# Patient Record
Sex: Female | Born: 1975 | Race: Asian | Hispanic: Refuse to answer | Marital: Married | Smoking: Never smoker
Health system: Southern US, Community
[De-identification: ages and names within clinical notes are randomized; demographics above are authoritative.]

## PROBLEM LIST (undated history)

## (undated) ENCOUNTER — Inpatient Hospital Stay (HOSPITAL_COMMUNITY): Payer: Self-pay

## (undated) DIAGNOSIS — K2970 Gastritis, unspecified, without bleeding: Secondary | ICD-10-CM

## (undated) DIAGNOSIS — B9681 Helicobacter pylori [H. pylori] as the cause of diseases classified elsewhere: Secondary | ICD-10-CM

## (undated) DIAGNOSIS — O039 Complete or unspecified spontaneous abortion without complication: Secondary | ICD-10-CM

## (undated) HISTORY — DX: Complete or unspecified spontaneous abortion without complication: O03.9

---

## 2012-06-29 ENCOUNTER — Inpatient Hospital Stay (HOSPITAL_COMMUNITY)
Admission: AD | Admit: 2012-06-29 | Discharge: 2012-06-29 | Disposition: A | Discharge status: Home / Self Care | Payer: Managed Care, Other (non HMO) | Source: Ambulatory Visit | Attending: Obstetrics & Gynecology | Admitting: Obstetrics & Gynecology

## 2012-06-29 ENCOUNTER — Encounter (HOSPITAL_COMMUNITY): Payer: Self-pay | Admitting: *Deleted

## 2012-06-29 ENCOUNTER — Inpatient Hospital Stay (HOSPITAL_COMMUNITY): Payer: Managed Care, Other (non HMO)

## 2012-06-29 DIAGNOSIS — O200 Threatened abortion: Secondary | ICD-10-CM

## 2012-06-29 DIAGNOSIS — O209 Hemorrhage in early pregnancy, unspecified: Secondary | ICD-10-CM | POA: Insufficient documentation

## 2012-06-29 DIAGNOSIS — O039 Complete or unspecified spontaneous abortion without complication: Secondary | ICD-10-CM

## 2012-06-29 HISTORY — DX: Complete or unspecified spontaneous abortion without complication: O03.9

## 2012-06-29 LAB — CBC
HCT: 43.4 % (ref 36.0–46.0)
MCH: 30 pg (ref 26.0–34.0)
MCV: 87.5 fL (ref 78.0–100.0)
WBC: 10.2 10*3/uL (ref 4.0–10.5)

## 2012-06-29 LAB — URINE MICROSCOPIC-ADD ON

## 2012-06-29 LAB — URINALYSIS, ROUTINE W REFLEX MICROSCOPIC
Bilirubin Urine: NEGATIVE
Glucose, UA: NEGATIVE mg/dL
Specific Gravity, Urine: 1.02 (ref 1.005–1.030)
Urobilinogen, UA: 0.2 mg/dL (ref 0.0–1.0)
pH: 6 (ref 5.0–8.0)

## 2012-06-29 LAB — POCT PREGNANCY, URINE: Preg Test, Ur: POSITIVE — AB

## 2012-06-29 LAB — HCG, QUANTITATIVE, PREGNANCY: hCG, Beta Chain, Quant, S: 10021 m[IU]/mL — ABNORMAL HIGH (ref <5)

## 2012-06-29 NOTE — MAU Provider Note (Signed)
History     CSN: [id number]  Arrival date and time: 06/29/12 0757   First Provider Initiated Contact with Patient 06/29/12 [phone]      Chief Complaint  Patient presents with  . Vaginal Bleeding   HPI Patient is a 36 yo G2P1001 at 11.3 EGA by LMP who presents with complaint of vaginal bleeding.  Patient states this began yesterday when she noticed a small amount of pink discharge as she wiped after going to the bathroom.  This morning she states she noticed some drops of red blood in the toilet.  Since then she has continued to bleed. She has been wearing a pad and has changed it 2x, with only small amounts of blood on each pad. Her current pad has a small amount of blood on it as well. She also noted some lower abdominal cramping this morning that is like her period.  States this has not gotten any better.  She denies any intercourse or vaginal exams since finding out she was pregnant.  She denies any other complaints.  OB History    Grav Para Term Preterm Abortions TAB SAB Ect Mult Living   2 1 1       1      No prenatal care to this point, just recently found out she is pregnant.  Past Medical History  Diagnosis Date  . No pertinent past medical history     Past Surgical History  Procedure Date  . Cesarean section     History reviewed. No pertinent family history.  History  Substance Use Topics  . Smoking status: Never Smoker   . Smokeless tobacco: Never Used  . Alcohol Use: No    Allergies: No Known Allergies  Prescriptions prior to admission  Medication Sig Dispense Refill  . Prenatal Vit-Fe Fumarate-FA (PRENATAL MULTIVITAMIN) TABS Take 1 tablet by mouth daily.        ROS negative except per HPI Physical Exam   Blood pressure 109/76, pulse 83, temperature 98.3 F (36.8 C), temperature source Oral, resp. rate 16, height 5' 1.75" (1.568 m), weight 50.349 kg (111 lb), last menstrual period 04/10/2012, SpO2 100.00%.  Physical Exam  Constitutional: She appears  well-developed and well-nourished.  HENT:  Head: Normocephalic and atraumatic.  Cardiovascular: Normal rate, regular rhythm and normal heart sounds.   Respiratory: Effort normal and breath sounds normal.  GI: Soft. Bowel sounds are normal. She exhibits no distension. There is tenderness (suprapubic and right lower quadrant tenderness). There is no rebound and no guarding.  Genitourinary:       Speculum: blood visualized in vaginal vault and posterior vaginal fornix, appeared to be small amount of blood coming out of the cervix, cervix closed Bimanual: suprapubic tenderness and right adnexal tenderness   OB US less than 14 weeks: intrauterine pregnancy identified, irregular gestational sac, fetal pole was identified, no cardiac activity.  Urine Pregnancy Test: positive HCG: 10021 Blood type: B+ Lab Results  Component Value Date   WBC 10.2 06/29/2012   HGB 14.9 06/29/2012   HCT 43.4 06/29/2012   MCV 87.5 06/29/2012   PLT 234 06/29/2012   MAU Course  Procedures  Assessment and Plan  Patient is a G2P1001 at 11.3 EGA who presents for vaginal bleeding.  [place] concerning for possible miscarriage.  Patient was given appointment for follow-up US in 7 days to determine viability of pregnancy. Patient was discharged from MAU and was given information about miscarriage, with information on what to look for to cause her to return  to the MAU.  Marikay Alar 06/29/2012, 9:26 AM

## 2012-06-29 NOTE — MAU Note (Signed)
Pt states the vaginal bleeding started this am but did have some pinking discharge yesterday when going to bathroom.

## 2012-06-29 NOTE — MAU Note (Signed)
Patient states she has some pink discharge last night and this morning bleeding. Patient has on a pad with a small streak of dark red blood. Having lower abdominal pain.

## 2012-06-30 ENCOUNTER — Inpatient Hospital Stay (HOSPITAL_COMMUNITY)
Admission: AD | Admit: 2012-06-30 | Discharge: 2012-06-30 | Disposition: A | Discharge status: Home / Self Care | Payer: Managed Care, Other (non HMO) | Source: Ambulatory Visit | Attending: Obstetrics & Gynecology | Admitting: Obstetrics & Gynecology

## 2012-06-30 DIAGNOSIS — O200 Threatened abortion: Secondary | ICD-10-CM | POA: Insufficient documentation

## 2012-06-30 DIAGNOSIS — O039 Complete or unspecified spontaneous abortion without complication: Secondary | ICD-10-CM

## 2012-06-30 LAB — GC/CHLAMYDIA PROBE AMP, URINE
Chlamydia, Swab/Urine, PCR: NEGATIVE
GC Probe Amp, Urine: NEGATIVE

## 2012-06-30 MED ORDER — PROMETHAZINE HCL 25 MG PO TABS: 12.5000 mg | ORAL_TABLET | Freq: Four times a day (QID) | ORAL | Status: DC | PRN

## 2012-06-30 MED ORDER — OXYCODONE-ACETAMINOPHEN 5-325 MG PO TABS
1.0000 | ORAL_TABLET | Freq: Once | ORAL | Status: AC
  Administered 2012-06-30: 1 via ORAL
  Filled 2012-06-30: qty 1

## 2012-06-30 MED ORDER — KETOROLAC TROMETHAMINE 60 MG/2ML IM SOLN
60.0000 mg | Freq: Once | INTRAMUSCULAR | Status: AC
  Administered 2012-06-30: 60 mg via INTRAMUSCULAR
  Filled 2012-06-30: qty 2

## 2012-06-30 MED ORDER — HYDROCODONE-ACETAMINOPHEN 5-325 MG PO TABS: 2.0000 | ORAL_TABLET | ORAL | Status: AC | PRN

## 2012-06-30 MED ORDER — IBUPROFEN 600 MG PO TABS: 600.0000 mg | ORAL_TABLET | Freq: Four times a day (QID) | ORAL | Status: AC | PRN

## 2012-06-30 MED ORDER — MISOPROSTOL 200 MCG PO TABS: 800.0000 ug | ORAL_TABLET | Freq: Four times a day (QID) | ORAL | Status: DC

## 2012-06-30 NOTE — MAU Note (Signed)
Explained to patient and her husband results of [place] yesterday offered to get interpreter line for [place] patient refused stating "I understand English".

## 2012-06-30 NOTE — MAU Note (Signed)
Patient states that she has changed her pad 3 times today.

## 2012-06-30 NOTE — MAU Provider Note (Signed)
Pt seen and examined.  Reviewed with pt treatment options of operative D&C vs conservative management with cytotec.  Reviewed the risks and benefits of both.  Pt opted for cytotec.  Will f/u with me in odc in 2 weeks.  Sono for Friday cancelled.  Milessa Hogan L. Harraway-Smith, M.D., [facility]

## 2012-06-30 NOTE — MAU Provider Note (Signed)
History     CSN: [id number]  Arrival date and time: 06/30/12 1223   None     Chief Complaint  Patient presents with  . Vaginal Bleeding  . Abdominal Pain   HPI Phyllis Morton is a 36 y.o. female who presents to MAU @ [redacted]w[redacted]d  with vaginal bleeding. Sure LMP of 04/10/12. She was evaluated here yesterday for same. Ultrasound yesterday shows a 6 week 1 day irregular IUGS and no cardiac activity. The plan was to return in one week to repeat the ultrasound. The patient returns today with increased bleeding and cramping.  OB History    Grav Para Term Preterm Abortions TAB SAB Ect Mult Living   2 1 1       1       Past Medical History  Diagnosis Date  . No pertinent past medical history     Past Surgical History  Procedure Date  . Cesarean section     No family history on file.  History  Substance Use Topics  . Smoking status: Never Smoker   . Smokeless tobacco: Never Used  . Alcohol Use: No    Allergies: No Known Allergies  Prescriptions prior to admission  Medication Sig Dispense Refill  . Prenatal Vit-Fe Fumarate-FA (PRENATAL MULTIVITAMIN) TABS Take 1 tablet by mouth daily.        ROS: as stated in HPI    Blood pressure 123/83, pulse 89, temperature 98.2 F (36.8 C), temperature source Oral, resp. rate 16, last menstrual period 04/10/2012.  Physical Exam  Constitutional: She is oriented to person, place, and time. She appears well-developed and well-nourished. No distress.  HENT:  Head: Normocephalic and atraumatic.  Eyes: EOM are normal.  Neck: Neck supple.  Cardiovascular: Normal rate.   Respiratory: Effort normal.  GI: Soft. There is tenderness.       Mildly tender lower abdomen with palpation.  Genitourinary:       External genitalia without lesions. Scant dark blood vaginal vault. Cervix closed, positive CMT, bilateral adnexal tenderness, uterus slightly enlarged.  Musculoskeletal: Normal range of motion.  Neurological: She is alert and oriented to  person, place, and time.  Skin: Skin is warm and dry.  Psychiatric: She has a normal mood and affect. Her behavior is normal. Judgment and thought content normal.    MAU Course: discussed with Dr. Erin Fulling. Will offer patient Cytotec and pain management.    Procedures *Ultrasound from 06/29/12*   *Labs from 06/30/19*  Results for orders placed during the hospital encounter of 06/29/12 (from the past 48 hour(s))  URINALYSIS, ROUTINE W REFLEX MICROSCOPIC     Status: Abnormal   Collection Time   06/29/12  8:23 AM      Component Value Range Comment   Color, Urine YELLOW  YELLOW    APPearance CLEAR  CLEAR    Specific Gravity, Urine 1.020  1.005 - 1.030    pH 6.0  5.0 - 8.0    Glucose, UA NEGATIVE  NEGATIVE mg/dL    Hgb urine dipstick LARGE (*) NEGATIVE    Bilirubin Urine NEGATIVE  NEGATIVE    Ketones, ur NEGATIVE  NEGATIVE mg/dL    Protein, ur NEGATIVE  NEGATIVE mg/dL    Urobilinogen, UA 0.2  0.0 - 1.0 mg/dL    Nitrite NEGATIVE  NEGATIVE    Leukocytes, UA NEGATIVE  NEGATIVE   URINE MICROSCOPIC-ADD ON     Status: Normal   Collection Time   06/29/12  8:23 AM  Component Value Range Comment   Squamous Epithelial / LPF RARE  RARE    RBC / HPF 0-2  <3 RBC/hpf    Bacteria, UA RARE  RARE    Urine-Other MUCOUS PRESENT     POCT PREGNANCY, URINE     Status: Abnormal   Collection Time   06/29/12  8:35 AM      Component Value Range Comment   Preg Test, Ur POSITIVE (*) NEGATIVE   ABO/RH     Status: Normal   Collection Time   06/29/12  9:23 AM      Component Value Range Comment   ABO/RH(D) B POS     CBC     Status: Normal   Collection Time   06/29/12  9:30 AM      Component Value Range Comment   WBC 10.2  4.0 - 10.5 K/uL    RBC 4.96  3.87 - 5.11 MIL/uL    Hemoglobin 14.9  12.0 - 15.0 g/dL    HCT 13.0  86.5 - 78.4 %    MCV 87.5  78.0 - 100.0 fL    MCH 30.0  26.0 - 34.0 pg    MCHC 34.3  30.0 - 36.0 g/dL    RDW 69.6  29.5 - 28.4 %    Platelets 234  150 - 400 K/uL   HCG,  QUANTITATIVE, PREGNANCY     Status: Abnormal   Collection Time   06/29/12  9:30 AM      Component Value Range Comment   hCG, Beta Chain, Quant, S 10021 (*) <5 mIU/mL    Dr. Erin Fulling in to discuss options with patient.  Patient will use Cytotec. Explained in detail to patient and her partner how to use and what to expect. Patient voices understanding.       Early Intrauterine Pregnancy Failure  _x__  Documented intrauterine pregnancy failure less than or equal to [redacted] weeks gestation  __x_  No serious current illness  __x_  Baseline Hgb greater than or equal to 10g/dl  _x__  Patient has easily accessible transportation to the hospital  _x__  Clear preference  __x_  Practitioner/physician deems patient reliable  _x__  Counseling by practitioner or physician  _x__  Patient education by RN  __x_  Consent form signed  ___  Rho-Gam given by RN if indicated  __x_ Medication dispensed   _x__   Cytotec 800 mcg  __   Intravaginally by patient at home         __   Intravaginally by RN in MAU        _x_   Rectally by patient at home        __   Rectally by RN in MAU  _x__  Ibuprofen 600 mg 1 tablet by mouth every 6 hours as needed #30  _x__  Hydrocodone/acetaminophen 5/325 mg by mouth every 4 to 6 hours as needed  _x__  Phenergan 12.5 mg by mouth every 4 hours as needed for nausea   Assessment: Impending AB  Plan:  Cytotec per protocol   Follow up with [facility] 2 weeks (note to clinic to call)   Cancel follow up ultrasound appointment.    Nichalos Brenton 06/30/2012, 12:42 PM

## 2012-06-30 NOTE — MAU Note (Signed)
Patient brought directly back to room 6 from lobby, patient c/o increased bleeding and more pain today, was seen in MAU yesterday for same.

## 2012-06-30 NOTE — MAU Note (Signed)
Patient signed consent form for Cytotec.

## 2012-07-02 NOTE — MAU Provider Note (Signed)
I examined pt and agree with documentation above and resident plan of care. Phyllis Morton,Phyllis Morton  

## 2012-07-06 ENCOUNTER — Ambulatory Visit (HOSPITAL_COMMUNITY): Payer: Managed Care, Other (non HMO)

## 2012-07-18 ENCOUNTER — Other Ambulatory Visit (HOSPITAL_COMMUNITY)
Admission: RE | Admit: 2012-07-18 | Discharge: 2012-07-18 | Disposition: A | Discharge status: Home / Self Care | Payer: Managed Care, Other (non HMO) | Source: Ambulatory Visit | Attending: Obstetrics & Gynecology | Admitting: Obstetrics & Gynecology

## 2012-07-18 ENCOUNTER — Ambulatory Visit (INDEPENDENT_AMBULATORY_CARE_PROVIDER_SITE_OTHER): Payer: Managed Care, Other (non HMO) | Admitting: Obstetrics & Gynecology

## 2012-07-18 ENCOUNTER — Encounter: Payer: Self-pay | Admitting: Obstetrics & Gynecology

## 2012-07-18 VITALS — BP 119/83 | HR 102 | Temp 98.5°F | Ht 61.0 in | Wt 111.6 lb

## 2012-07-18 DIAGNOSIS — O209 Hemorrhage in early pregnancy, unspecified: Secondary | ICD-10-CM | POA: Insufficient documentation

## 2012-07-18 DIAGNOSIS — Z3009 Encounter for other general counseling and advice on contraception: Secondary | ICD-10-CM

## 2012-07-18 DIAGNOSIS — O039 Complete or unspecified spontaneous abortion without complication: Secondary | ICD-10-CM

## 2012-07-18 MED ORDER — LEVONORGESTREL-ETHINYL ESTRAD 0.15-30 MG-MCG PO TABS: 1.0000 | ORAL_TABLET | Freq: Every day | ORAL | Status: DC

## 2012-07-18 MED ORDER — DOXYCYCLINE HYCLATE 50 MG PO CAPS: 100.0000 mg | ORAL_CAPSULE | Freq: Two times a day (BID) | ORAL | Status: AC

## 2012-07-18 MED ORDER — METRONIDAZOLE 500 MG PO TABS: 500.0000 mg | ORAL_TABLET | Freq: Two times a day (BID) | ORAL | Status: AC

## 2012-07-18 NOTE — Progress Notes (Signed)
Subjective:     Patient ID: Phyllis Morton, female   DOB: 12-25-1975, 36 y.o.   MRN: [id number]  HPI Pt with SAB took cytotec.  Pt reports that she is persisting with bleeding. No pain.     Review of Systems N/C     Objective:   Physical Exam GU: EGBUS: no lesions Vagina: no blood in vault Cervix: tissue coming from cervix.  Removed with Ringed Forceps Uterus: small, mobile Adnexa: no masses; sl tender         Assessment:     SAB; tissue removed from os---VERY foul odor    Plan:     F/u path Flagyl 500mg  bid x 7 day Doxycycline 100mg  bid x 7 days Lo Estrin 1 po q day  Amali Uhls L. Erin Fulling, M.D., [place] [place] /

## 2012-07-18 NOTE — Patient Instructions (Addendum)
S?y [place] (S?y [place] T? Nhin) (Miscarriage [Spontaneous Miscarriage]) S?y thai l khi b?n m?t [place] nhi tr?c tu?n th? hai mi c?a [place] k?. S?y thai x?y ra trong 15-20% tr?ng h?p [place] nghn. H?u h?t cc tr?ng h?p s?y thai x?y ra trong 13 tu?n ?u c?a [place] k?. V? m?t y t?, y ?c g?i l s?y [place] t? nhin. Khng c?n i?u tr? b? sung khi s?y thai hon ton v t?t c? s?n ph?m th? thai  ?c ?y ra kh?i c th?. B?n c th? b?t ?u tm cch mang thai l?i ngay sau khi chuyn gia chm [place] y t? c?a b?n ni r?ng b?n c th? lm nh v?y. NGUYN NHN  H?u h?t nguyn nhn khng ?c xc ?nh.   Cc v?n ? chung di truy?n nh nhi?m s?c th? b?t th?ng, khng ? ho?c qu nhi?u.   Nhi?m trng c? t? cung ho?c t? cung.   T? cung c hnh d?ng d? th?ng, u x ho?c cc b?t th?ng b?m sinh.   V?n ? v? hooc-mn.   V?n ? v? y t?.   C? t? cung khng ? nng l?c, m trong c? t? cung khng ? m?nh ? gi? thai.   Ht thu?c l, u?ng r?u qu nhi?u v cc lo?i thu?c b?t h?p php.   Ch?n thng.  TRI?U CH?NG  Ch?y mu ho?c ?m t? m ?o.   Co th?t b?ng d?i.   D?ch ch?y ra t? m ?o cng v?i ho?c khng cng v?i chu?t rt ho?c au.   Ch?y m bo [place].  I?U TR?  i khi khng c?n i?u tr? thm n?u t?t c? cc m trong t? cung  ?y ra ngoi.   N?u m?t ph?n bo [place] ho?c nhau thai v?n cn trong c th? (s?y [place] ?y ?), m cn l?i c th? b? nhi?m trng. Thng th?ng, c?n n?o ht ki?u D v C (nong v n?o) ho?c n?o t? cung ? lo?i b? cc m cn l?i trong t? cung. Th? thu?t ny ch? ?c th?c hi?n khi chuyn gia chm [place] y t? c?a b?n bi?t r?ng khng c c h?i ? [place] nhi ti?p t?c t?n t?i. i?u ny ?c xc ?nh b?i vi?c khm tr?c ti?p, xt nghi?m [place] m tnh, xt nghi?m mu v c th? siu m cho th?y m?t bo [place] ch?t ho?c thai nhi khng pht tri?n v m?t v?n ? x?y ra vo lc th? [place] (khi k?t h?p tinh trng v tr?ng).   C th? c?n dng thu?c, thu?c khng sinh n?u b? nhi?m trng ho?c thu?c co rt t? cung  n?u ch?y mu nhi?u.   N?u b?n c mu Rh m v b?n tnh c?a b?n c mu Rh dng, b?n s? c?n tim Rho-gam (m?t lo?i v?cxin globulin mi?n d?ch). i?u ny s? b?o v? em b c?a b?n khng b? cc v?n ? v? mu Rh trong [place] k? trong tng lai.  H?NG D?N CHM [place] T?I NH  Chuyn gia chm [place] y t? c?a b?n c th? yu c?u ngh? ngi t?i gi?ng (ch? d?y ? v? sinh). Chuyn gia chm [place] y t? c th? cho php b?n ti?p t?c ho?t ?ng nh?. B?n c th? c?n ph?i s?p x?p cho vi?c chm [place] con v b?t k? trch nhi?m no khc.   Theo di s? l?ng bng v? sinh b?n s? d?ng m?i ngy v m?c ? ng?m (bo ha) c?a chng. Ghi l?i thng tin ny.   Khng s? d?ng bng v?  sinhImagene Sheller th?t r?a m ?o ho?c quan h? tnh d?c cho ?n khi ?c ch?p thu?n b?i chuyn gia chm [place] y t?.   Ch? s? d?ng thu?c mua tr?c ti?p t?i hi?u thu?c ho?c thu?c theo toa ? gi?m au, gi?m s? kh ch?u ho?c h? s?t theo ch? d?n c?a chuyn gia chm [place] y t?.   Khng u?ng atpirin v n c th? gy ch?y mu.   i?u r?t quan tr?ng l gi? ng t?t c? cc cu?c h?n ti?p theo ? nh gi l?i v ti?p t?c qu?n l.   Ni cho chuyn gia chm [place] y t? c?a b?n bi?t n?u b?n ang tr?i qua b?o l?c gia nh.   Nh?ng ph? n? c nhm mu Rh m (t?c l, A, B, AB ho?c O m) c?n ph?i s? d?ng thu?c c tn globulin mi?n d?ch Rh(D) (RhoGam). Thu?c ny gip b?o v? bo thai trong tng lai ch?ng l?i cc v?n ? c th? x?y ra n?u ng?i m? Rh m mang thai em b c Rh dng.   N?u b?n v/ho?c b?n tnh c?a b?n ang g?p v?n ? v?i c?m gic t?i l?i hay au bu?n, hy ni chuy?n v?i chuyn gia chm [place] y t? c?a b?n ho?c tm ki?m s? t v?n ? gip b?n ?i ph v?i s? s?y [place]. Cho php ? th?i gian ? s? au bu?n vi i tr?c khi c? g?ng c thai l?i.  HY NGAY L?P T?C THAM V?N V?I CHUYN GIA Y T? N?U:  B?n b? chu?t rt ho?c au n?ng trong d? dy, lng ho?c b?ng.   B?n b? s?t.   B?n cho ra c?c mu ng l?n ho?c m. Gi? l?i m?i m ? chuyn gia chm [place] y t? c?a b?n ki?m tra.   B?n b? ch?y  mu gia tng.   B?n c?m th?y mu?n ng?t, y?u ho?c ng?t x?u.   B?n pht tri?n ?n l?nh.  Document Released: 09/07/2005 Document Revised: 11/17/2011 [facility] Patient Information 2012 [place], [place].

## 2012-08-20 ENCOUNTER — Ambulatory Visit: Payer: Managed Care, Other (non HMO) | Admitting: Physician Assistant

## 2012-08-20 VITALS — BP 98/60 | HR 80 | Temp 98.0°F | Resp 12 | Ht 62.0 in | Wt 111.8 lb

## 2012-08-20 DIAGNOSIS — R599 Enlarged lymph nodes, unspecified: Secondary | ICD-10-CM

## 2012-08-20 DIAGNOSIS — R591 Generalized enlarged lymph nodes: Secondary | ICD-10-CM

## 2012-08-20 DIAGNOSIS — H9209 Otalgia, unspecified ear: Secondary | ICD-10-CM

## 2012-08-20 LAB — POCT CBC
HCT, POC: 42.9 % (ref 37.7–47.9)
Hemoglobin: 13.5 g/dL (ref 12.2–16.2)
MCH, POC: 29 pg (ref 27–31.2)
MCV: 92.2 fL (ref 80–97)
MID (cbc): 0.5 (ref 0–0.9)
Platelet Count, POC: 265 10*3/uL (ref 142–424)
RBC: 4.65 M/uL (ref 4.04–5.48)
WBC: 8.3 10*3/uL (ref 4.6–10.2)

## 2012-08-20 MED ORDER — IPRATROPIUM BROMIDE 0.03 % NA SOLN: 2.0000 | Freq: Two times a day (BID) | NASAL | Status: AC

## 2012-08-20 MED ORDER — AMOXICILLIN 875 MG PO TABS: 875.0000 mg | ORAL_TABLET | Freq: Two times a day (BID) | ORAL | Status: AC

## 2012-08-20 NOTE — Progress Notes (Signed)
  Subjective:    Patient ID: Phyllis Morton, female    DOB: 08/02/1976, 36 y.o.   MRN: [id number]  HPI This 36 y.o. female presents for evaluation of pain in the right neck, ear and headache x 1 week.  She denies nasal congestion, drainage, sore throat, cough, hearing loss, nausea, vomiting, diarrhea, myalgias, arthralgias and rash.    Review of Systems As above.   Past Medical History  Diagnosis Date  . No pertinent past medical history   . Miscarriage 06/29/2012    LMP 04/10/2012    Past Surgical History  Procedure Date  . Cesarean section     Prior to Admission medications   Not on File    No Known Allergies  History   Social History  . Marital Status: Married    Spouse Name: [name]    Number of Children: 1  . Years of Education: 9   Occupational History  . NAIL TECHNICIAN    Social History Main Topics  . Smoking status: Never Smoker   . Smokeless tobacco: Never Used  . Alcohol Use: No  . Drug Use: No  . Sexually Active: Yes -- Female partner(s)    Birth Control/ Protection: None   Other Topics Concern  . Not on file   Social History Narrative   From [place].  In the [place] since 2008.    Family History  Problem Relation Age of Onset  . Hypertension Mother        Objective:   Physical Exam   Results for orders placed in visit on 08/20/12  POCT CBC      Component Value Range   WBC 8.3  4.6 - 10.2 K/uL   Lymph, poc 2.1  0.6 - 3.4   POC LYMPH PERCENT 24.8  10 - 50 %L   MID (cbc) 0.5  0 - 0.9   POC MID % 5.7  0 - 12 %M   POC Granulocyte 5.8  2 - 6.9   Granulocyte percent 69.5  37 - 80 %G   RBC 4.65  4.04 - 5.48 M/uL   Hemoglobin 13.5  12.2 - 16.2 g/dL   HCT, POC 56.2  13.0 - 47.9 %   MCV 92.2  80 - 97 fL   MCH, POC 29.0  27 - 31.2 pg   MCHC 31.5 (*) 31.8 - 35.4 g/dL   RDW, POC 86.5     Platelet Count, POC 265  142 - 424 K/uL   MPV 8.3  0 - 99.8 fL        Assessment & Plan:   1. Lymphadenopathy  POCT CBC, amoxicillin (AMOXIL) 875 MG  tablet  2. Otalgia  ipratropium (ATROVENT) 0.03 % nasal spray   Patient Instructions  Please take Ibuprofen or acetaminophen as needed for pain in your neck and head. Get plenty of rest and drink at least 64 ounces of water daily.

## 2012-08-20 NOTE — Patient Instructions (Addendum)
Please take Ibuprofen or acetaminophen as needed for pain in your neck and head. Get plenty of rest and drink at least 64 ounces of water daily.

## 2013-05-27 ENCOUNTER — Other Ambulatory Visit (HOSPITAL_COMMUNITY)
Admission: RE | Admit: 2013-05-27 | Discharge: 2013-05-27 | Disposition: A | Discharge status: Home / Self Care | Payer: Managed Care, Other (non HMO) | Source: Ambulatory Visit | Attending: Family Medicine | Admitting: Family Medicine

## 2013-05-27 ENCOUNTER — Other Ambulatory Visit: Payer: Self-pay | Admitting: Family Medicine

## 2013-05-27 DIAGNOSIS — Z124 Encounter for screening for malignant neoplasm of cervix: Secondary | ICD-10-CM | POA: Insufficient documentation

## 2013-05-27 DIAGNOSIS — Z1151 Encounter for screening for human papillomavirus (HPV): Secondary | ICD-10-CM | POA: Insufficient documentation

## 2015-10-03 ENCOUNTER — Ambulatory Visit (INDEPENDENT_AMBULATORY_CARE_PROVIDER_SITE_OTHER): Payer: 59 | Admitting: Physician Assistant

## 2015-10-03 ENCOUNTER — Ambulatory Visit (INDEPENDENT_AMBULATORY_CARE_PROVIDER_SITE_OTHER): Payer: 59

## 2015-10-03 VITALS — BP 117/80 | HR 96 | Temp 98.7°F | Resp 16 | Ht 62.0 in | Wt 115.0 lb

## 2015-10-03 DIAGNOSIS — M25561 Pain in right knee: Secondary | ICD-10-CM

## 2015-10-03 DIAGNOSIS — R1084 Generalized abdominal pain: Secondary | ICD-10-CM

## 2015-10-03 DIAGNOSIS — K219 Gastro-esophageal reflux disease without esophagitis: Secondary | ICD-10-CM | POA: Diagnosis not present

## 2015-10-03 DIAGNOSIS — R1032 Left lower quadrant pain: Secondary | ICD-10-CM | POA: Diagnosis not present

## 2015-10-03 DIAGNOSIS — R1031 Right lower quadrant pain: Secondary | ICD-10-CM | POA: Diagnosis not present

## 2015-10-03 LAB — COMPLETE METABOLIC PANEL WITH GFR
ALBUMIN: 4.5 g/dL (ref 3.6–5.1)
ALK PHOS: 48 U/L (ref 33–115)
ALT: 11 U/L (ref 6–29)
AST: 23 U/L (ref 10–30)
BILIRUBIN TOTAL: 0.4 mg/dL (ref 0.2–1.2)
BUN: 12 mg/dL (ref 7–25)
CALCIUM: 9.5 mg/dL (ref 8.6–10.2)
CO2: 25 mmol/L (ref 20–31)
CREATININE: 0.55 mg/dL (ref 0.50–1.10)
Chloride: 103 mmol/L (ref 98–110)
GFR, Est African American: >89 mL/min (ref >=60)
GFR, Est Non African American: >89 mL/min (ref >=60)
Glucose, Bld: 96 mg/dL (ref 65–99)
POTASSIUM: 4.1 mmol/L (ref 3.5–5.3)
Sodium: 138 mmol/L (ref 135–146)
TOTAL PROTEIN: 7.8 g/dL (ref 6.1–8.1)

## 2015-10-03 LAB — POCT CBC
GRANULOCYTE PERCENT: 65.4 % (ref 37–80)
HCT, POC: 42.2 % (ref 37.7–47.9)
Hemoglobin: 14.3 g/dL (ref 12.2–16.2)
LYMPH, POC: 2 (ref 0.6–3.4)
MCH, POC: 29.5 pg (ref 27–31.2)
MCHC: 33.8 g/dL (ref 31.8–35.4)
MCV: 87.3 fL (ref 80–97)
MID (CBC): 0.5 (ref 0–0.9)
MPV: 6.8 fL (ref 0–99.8)
PLATELET COUNT, POC: 240 10*3/uL (ref 142–424)
POC Granulocyte: 4.7 (ref 2–6.9)
POC LYMPH %: 27.8 % (ref 10–50)
POC MID %: 6.8 %M (ref 0–12)
RBC: 4.84 M/uL (ref 4.04–5.48)
RDW, POC: 11.8 %
WBC: 7.2 10*3/uL (ref 4.6–10.2)

## 2015-10-03 LAB — POCT URINALYSIS DIP (MANUAL ENTRY)
BILIRUBIN UA: NEGATIVE
GLUCOSE UA: NEGATIVE
Ketones, POC UA: NEGATIVE
LEUKOCYTES UA: NEGATIVE
NITRITE UA: NEGATIVE
Protein Ur, POC: NEGATIVE
Spec Grav, UA: 1.02
Urobilinogen, UA: 0.2
pH, UA: 7

## 2015-10-03 LAB — POC MICROSCOPIC URINALYSIS (UMFC): Mucus: ABSENT

## 2015-10-03 LAB — POCT URINE PREGNANCY: PREG TEST UR: NEGATIVE

## 2015-10-03 MED ORDER — MELOXICAM 7.5 MG PO TABS: 7.5000 mg | ORAL_TABLET | Freq: Every day | ORAL | Status: DC

## 2015-10-03 MED ORDER — RANITIDINE HCL 150 MG PO CAPS: 150.0000 mg | ORAL_CAPSULE | Freq: Two times a day (BID) | ORAL | Status: DC

## 2015-10-03 MED ORDER — LANSOPRAZOLE 15 MG PO TBDP: 15.0000 mg | ORAL_TABLET | Freq: Every day | ORAL | Status: DC

## 2015-10-03 NOTE — Progress Notes (Signed)
[facility] [address], [place] [id number] [phone]  Date:  10/03/2015   Name:  Marge DuncansKim H Tzeng   DOB:  Jan 22, 1976   MRN:  [id number]  PCP:  No PCP Per Patient    History of Present Illness:  Marge DuncansKim H Traum is a 39 y.o. female patient [facility] chief complaint of lower abdominal pain that radiates up her abdomen to her throat for the last week.  She states that it is worse at night.  She has a sour brash taste.  She has very minimal nausea.  No diarrhea or constipation.  No bloody stool or melena.  No associated dizziness or sob.  She currently is taking her Prilosec which does not appear to be working at this time.  She has no loss of appetite or insatiability.  Symptoms do not correlate with meals but appear more constant.    Little et OH, caffeine, or NSAID use.   There are no active problems to display for this patient.   Past Medical History  Diagnosis Date  . No pertinent past medical history   . Miscarriage 06/29/2012    LMP 04/10/2012    Past Surgical History  Procedure Laterality Date  . Cesarean section      Social History  Substance Use Topics  . Smoking status: Never Smoker   . Smokeless tobacco: Never Used  . Alcohol Use: No    Family History  Problem Relation Age of Onset  . Hypertension Mother     No Known Allergies  Medication list has been reviewed and updated.  Current Outpatient Prescriptions on File Prior to Visit  Medication Sig Dispense Refill  . ipratropium (ATROVENT) 0.03 % nasal spray Place 2 sprays into the nose 2 (two) times daily. 30 mL 5   No current facility-administered medications on file prior to visit.    ROS ROS otherwise unremarkable unless listed above.   Physical Examination: BP 117/80 mmHg  Pulse 96  Temp(Src) 98.7 F (37.1 C) (Oral)  Resp 16  Ht 5\' 2"  (1.575 m)  Wt 115 lb (52.164 kg)  BMI 21.03 kg/m2  SpO2 99%  LMP 09/23/2015 Ideal Body Weight: Weight in (lb) to have BMI =  25: 136.4  Physical Exam  Constitutional: She is oriented to person, place, and time. She appears well-developed and well-nourished. No distress.  HENT:  Head: Normocephalic and atraumatic.  Right Ear: External ear normal.  Left Ear: External ear normal.  Eyes: Conjunctivae and EOM are normal. Pupils are equal, round, and reactive to light.  Cardiovascular: Normal rate.   Pulmonary/Chest: Effort normal. No respiratory distress.  Abdominal: Soft. Normal appearance and bowel sounds are normal. There is no hepatosplenomegaly. There is tenderness. There is no tenderness at McBurney's point and negative Murphy's sign.  Epigastric>lower abdomen.  Neurological: She is alert and oriented to person, place, and time.  Skin: She is not diaphoretic.  Psychiatric: She has a normal mood and affect. Her behavior is normal.   GI cocktail given with improvement of sxs.  [facility] reading (PRIMARY) by  Dr. Dareen PianoAnderson: Negative  Assessment and Plan: Marge DuncansKim H Weidmann is a 39 y.o. female who is here today for chief complaint of abdominal pain.  Diff., GERD, gastritis, h pylori -Advised to stop the Prilosec. -starting on prevacid at this time, and a h2 blocker ranitidine. I have advised that she return if her sxs do not improve in 2 weeks, or we will follow up in  4 weeks.   Due to PPI start-will not do breath test at this time.  Will try to treat next 6 weeks, and then consult with GI if sxs do not improve.  -Knee pain treat with once day low dose mobic to avoid potential harm to gi symptoms.  Given stretches, and advised to wear brace at work.  Will follow up at the 4 week visit.    Gastroesophageal reflux disease, esophagitis presence not specified - Plan: lansoprazole (PREVACID SOLUTAB) 15 MG disintegrating tablet, ranitidine (ZANTAC) 150 MG capsule  Generalized abdominal pain - Plan: POCT CBC, COMPLETE METABOLIC PANEL WITH GFR, POCT Microscopic Urinalysis ([facility]), POCT urinalysis dipstick, POCT urine pregnancy,  lansoprazole (PREVACID SOLUTAB) 15 MG disintegrating tablet, ranitidine (ZANTAC) 150 MG capsule  Bilateral lower abdominal pain - Plan: POCT CBC, POCT Microscopic Urinalysis ([facility]), POCT urinalysis dipstick, POCT urine pregnancy  Right knee pain - Plan: DG Knee Complete 4 Views Right, meloxicam (MOBIC) 7.5 MG tablet    Trena Platt, PA-C [facility] 10/03/2015 9:29 AM

## 2015-10-03 NOTE — Patient Instructions (Addendum)
Knee XRay was normal.  At this time, you should get a brace (pharmacy, [facility], [employer]), for work.  Ice the knee at night for 15 minutes.  You can take mobic once per day for pain.  Do not take it with aleve or advil, or other anti-inflammatories.  You may take tylenol.  I would like you to choose three exercises below and do them three times per day.   Please take the medication as instructed.  Stop taking the prevacid.  I will contact you with the results of your labs.  Please let me know if your symptoms do not improve in the next 2 weeks.  Let's plan to follow up in 4 weeks of your symptoms.  Please make sure you are following a good diet to avoid the reflux.   B?nh tro ng??c d? dy th?c qu?n, Ng??i l?n (Gastroesophageal Reflux Disease, Adult) Thng th??ng, th?c ?n di chuy?n xu?ng th?c qu?n v ? l?i d? dy ?? tiu ha. Tuy nhin, khi m?t ng??i b? b?nh tro ng??c d? dy th?c qu?n (GERD), th?c ?n v a xt trong d? dy tro ng??c tr? l?i th?c qu?n. Khi tnh tr?ng ny x?y ra, th?c qu?n b? lot v vim. D?n d?n, GERD c th? t?o ra nh?ng l? nh? (v?t lot) trn l?p nim m?c th?c qu?n.  NGUYN NHN Tnh tr?ng ny gy ra b?i m?t v?n ?? c?a ph?n c? gi?a th?c qu?n v d? dy (c? th?t th?c qu?n d??i, hay LES). Thng th??ng c? LES ?ng l?i sau khi th?c ?n ?i qua th?c qu?n vo d? dy. Khi LES b? y?u ho?c b?t th??ng, c? khng ?ng theo ?ng cch v ?i?u ? cho php th?c ?n v a xt d? dy tro ng??c tr? l?i th?c qu?n. LES c th? b? y?u do m?t s? ch?t ?n [place] nh?t ??nh, thu?c v cc tnh tr?ng b?nh l, bao g?m:  S? d?ng thu?c l.  Mang thai.  Thot v? honh.  S? d?ng nhi?u r??u.  M?t s? lo?i th?c ?n v ?? u?ng nh?t ??nh, nh? c ph, s c la, hnh v b?c h. CC Y?U T? NGUY C? Tnh tr?ng ny hay x?y ra h?n ?:  Nh?ng ng??i t?ng cn.  Nh?ng ng??i c cc b?nh ? m lin k?t.  Nh?ng ng??i s? d?ng thu?c NSAID. TRI?U CH?NG Nh?ng tri?u ch?ng c?a tnh tr?ng ny bao g?m:  ? nng.  Kh nu?t ho?c ?au khi  nu?t.  C?m th?y nh? c m?t kh?i c?c trong c? h?ng.  C?m gic ??ng trong mi?ng.  H?i th? hi.  C nhi?u n??c b?t.  C?m gic kh ch?u trong b?ng ho?c ch??ng b?ng.  ? h?i.  ?au ng?c.  Kh th? ho?c th? kh kh.  Ho lin t?c (m?n tnh) ho?c ho vo ban ?m.  B? h?ng l?p men r?ng.  S?t cn. Nh?ng tnh tr?ng khc nhau c th? gy ?au ng?c. B?o ??m ph?i ??n khm chuyn gia ch?m [place] s?c kh?e n?u qu v? b? ?au ng?c. CH?N ?ON Chuyn gia ch?m [place] s?c kh?e c?a qu v? s? h?i v? b?nh s? v khm th?c th? cho qu v?. ?? xc ??nh qu v? b? GERD nh? hay n?ng, chuyn gia ch?m [place] s?c kh?e c?ng c th? theo di qu v? ?p ?ng v?i vi?c ?i?u tr? nh? th? no. Qu v? c?ng c th? ph?i lm cc ki?m tra khc, bao g?m:  N?i soi ?? ki?m tra d? dy v th?c qu?n b?ng m?t camera nh?Marland Kitchen  Ki?m tra ?o n?ng ?? a xt trong th?c qu?n c?a qu v?.  Ki?m tra ?o m?c p l?c ln th?c qu?n c?a qu v?.  Nu?t bari ho?c nu?t bari ?i?u ch?nh ?? hi?n th? hnh dng, kch th??c v ch?c n?ng c?a th?c qu?n c?a qu v?. ?I?U TR? M?c tiu c?a ?i?u tr? l gip gi?m cc tri?u ch?ng v trnh bi?n ch?ng. Vi?c ?i?u tr? b?nh ny c th? khc nhau ty thu?c m?c ?? n?ng c?a tri?u ch?ng. Chuyn gia ch?m [place] s?c kh?e c?a qu v? c th? khuy?n ngh?:  Thay ??i ch? ?? ?n.  Thu?c.  Ph?u thu?t. H??NG D?N CH?M [place] T?I NH Ch? ?? ?n  Tun th? m?t ch? ?? ?n theo khuy?n ngh? c?a chuyn gia ch?m [place] s?c kh?e. Vi?c ny c th? l trnh cc th?c ?n v ?? u?ng nh?:  C ph v tr (c ho?c khng c caffeine).  ?? u?ng c ch?ar??u.  ?? u?ng t?ng l?c v ?? u?ng dng trong th? thao.  ?? u?ng c ga ho?c soda.  S c la v c ca.  B?c h v h??ng v? b?c h.  T?i v hnh.  C?i ng?a (Horseradish).  Cc th?c ?n nhi?u gia v? v a xt, bao g?m h?t tiu, b?t ?t, b?t ca ri, gi?m, n??c s?t cay, v n??c s?t barbecue.  N??c qu? ho?c qu? h? cam qut, ch?ng h?n nh? cam, chanh v chanh l cam.  Cc th?c ?n c c chua, nh? n??c x?t ??, ?t, n??c x?t  salsa, v pizza km x?t ??  Th?c ?n chin v nhi?u ch?t bo, ch?ng h?n nh? bnh rn, khoai ty chin, khoai ty rn v n??c x?t nhi?u ch?t bo.  Th?t nhi?u ch?t bo, ch?ng h?n nh? hot dog (bnh m k?p xc xch) v cc lo?i th?t ?? v tr?ng nhi?u m?, ch?ng h?n nh? th?t n?c l?ng, xc xch, gi?m bng v th?t l?n xng khi.  Nh?ng s?n ph?m b? s?a giu ch?t bo, nh? s?a nguyn kem, b? v pho mt kem.  ?n cc b?a nh?, th??ng xuyn thay v cc b?a no.  Trnh u?ng nhi?u n??c khi qu v? ?n.  Trnh ?n trong kho?ng 2-3 gi? tr??c khi ?i ng?.  Trnh n?m xu?ng ngay sau khi ?n.  Khngt?p th? d?c ngay sau khi ?n. H??ng d?n chung  Ch  ??n nh?ng thay ??i v? tri?u ch?ng c?a qu v?.  Ch? s? d?ng thu?c khng c?n k ??n v thu?c c?n k ??n theo ch? d?n c?a chuyn gia ch?m [place] s?c kh?e. Khng dng aspirin, ibuprofen, ho?c cc thu?c NSAID khc tr? khi chuyn gia ch?m [place] s?c kh?e c?a qu v? cho php.  Khng s? d?ng b?t k? s?n ph?m thu?c l no, bao g?m thu?c l d?ng ht, thu?c l d?ng nhai v thu?c l ?i?n t?. N?u qu v? c?n gip ?? ?? cai thu?c, hy h?i chuyn gia ch?m [place] s?c kh?e.  M?c qu?n o r?ng. Khng m?c ci g ch?t quanh eo m c th? t?o p l?c ln b?ng.  Nng (nng cao) ??u gi??ng c?a qu v? thm 6 inch (15 cm).  C? g?ng gi?m c?ng th?ng, ch?ng h?n nh? t?p yoga ho?c thi?n. N?u qu v? c?n gip ?? ?? gi?m c?ng th?ng, hy h?i chuyn gia ch?m [place] s?c kh?e.  N?u qu v? th?a cn, hy gi?m cn n?ng v? m?c c l?i cho s?c kh?e c?a qu v?. Hy h?i chuyn gia ch?m [place] s?c kh?e ?? ???c h??ng  d?n v? m?c tiu gi?m cn an ton.  Tun th? t?t c? cc cu?c h?n khm l?i theo ch? d?n c?a chuyn gia ch?m [place] s?c kh?e. ?i?u ny c vai tr quan tr?ng. ?I KHM N?U:  Qu v? c cc tri?u ch?ng m?i.  Qu v? b? s?t cn khng r nguyn nhn.  Qu v? b? kh nu?t ho?c b? ?au khi nu?t.  Qu v? th? kh kh ho?c ho dai d?ng.  Cc tri?u ch?ng c?a qu v? khng c?i thi?n sau khi ???c ?i?u tr?Ladell Heads.  Qu v? b? kh?n  gi?ng. NGAY L?P T?C ?I KHM N?U:  Qu v? b? ?au ? cnh tay, c?, hm, r?ng ho?c l?ng.  Qu v? th?y ?? m? hi, chng m?t ho?c chong vng.  Qu v? b? ?au ng?c ho?c kh th?.  Qu v? nn v ch?t nn ra gi?ng nh? mu ho?c b c ph.  Qu v? b? ng?t.  Phn c?a qu v? c mu ho?c mu ?en.  Qu v? khng th? nu?t, u?ng hay ?n.   Thng tin ny khng nh?m m?c ?ch thay th? cho l?i khuyn m chuyn gia ch?m [place] s?c kh?e ni v?i qu v?. Hy b?o ??m qu v? ph?i th?o lu?n b?t k? v?n ?? g m qu v? c v?i chuyn gia ch?m  s?c kh?e c?a qu v?.   Document Released: 09/07/2005 Document Revised: 08/19/2015 Elsevier Interactive Patient Education 2016 [employer]  Food Choices for Gastroesophageal Reflux Disease, Adult When you have gastroesophageal reflux disease (GERD), the foods you eat and your eating habits are very important. Choosing the right foods can help ease the discomfort of GERD. WHAT GENERAL GUIDELINES DO I NEED TO FOLLOW?  Choose fruits, vegetables, whole grains, low-fat dairy products, and low-fat meat, fish, and poultry.  Limit fats such as oils, salad dressings, butter, nuts, and avocado.  Keep a food diary to identify foods that cause symptoms.  Avoid foods that cause reflux. These may be different for different people.  Eat frequent small meals instead of three large meals each day.  Eat your meals slowly, in a relaxed setting.  Limit fried foods.  Cook foods using methods other than frying.  Avoid drinking alcohol.  Avoid drinking large amounts of liquids with your meals.  Avoid bending over or lying down until 2-3 hours after eating. WHAT FOODS ARE NOT RECOMMENDED? The following are some foods and drinks that may worsen your symptoms: Vegetables Tomatoes. Tomato juice. Tomato and spaghetti sauce. Chili peppers. Onion and garlic. Horseradish. Fruits Oranges, grapefruit, and lemon (fruit and juice). Meats High-fat meats, fish, and poultry. This includes hot  dogs, ribs, ham, sausage, salami, and bacon. Dairy Whole milk and chocolate milk. Sour cream. Cream. Butter. Ice cream. Cream cheese.  Beverages Coffee and tea, with or without caffeine. Carbonated beverages or energy drinks. Condiments Hot sauce. Barbecue sauce.  Sweets/Desserts Chocolate and cocoa. Donuts. Peppermint and spearmint. Fats and Oils High-fat foods, including [place] fries and potato chips. Other Vinegar. Strong spices, such as black pepper, white pepper, red pepper, cayenne, curry powder, cloves, ginger, and chili powder. The items listed above may not be a complete list of foods and beverages to avoid. Contact your dietitian for more information.   This information is not intended to replace advice given to you by your health care provider. Make sure you discuss any questions you have with your health care provider.   Document Released: 11/28/2005 Document Revised: 12/19/2014 Document Reviewed: 10/02/2013 Elsevier Interactive Patient Education [employer] Elsevier Inc.  Generic Knee Exercises EXERCISES RANGE OF MOTION (ROM) AND STRETCHING EXERCISES These exercises may help you when beginning to rehabilitate your injury. Your symptoms may resolve with or without further involvement from your physician, physical therapist, or athletic trainer. While completing these exercises, remember:   Restoring tissue flexibility helps normal motion to return to the joints. This allows healthier, less painful movement and activity.  An effective stretch should be held for at least 30 seconds.  A stretch should never be painful. You should only feel a gentle lengthening or release in the stretched tissue. STRETCH - Knee Extension, Prone  Lie on your stomach on a firm surface, such as a bed or countertop. Place your right / left knee and leg just beyond the edge of the surface. You may wish to place a towel under the far end of your right / left thigh for comfort.  Relax your leg muscles and  allow gravity to straighten your knee. Your clinician may advise you to add an ankle weight if more resistance is helpful for you.  You should feel a stretch in the back of your right / left knee. Hold this position for __________ seconds. Repeat __________ times. Complete this stretch __________ times per day. * Your physician, physical therapist, or athletic trainer may ask you to add ankle weight to enhance your stretch.  RANGE OF MOTION - Knee Flexion, Active  Lie on your back with both knees straight. (If this causes back discomfort, bend your opposite knee, placing your foot flat on the floor.)  Slowly slide your heel back toward your buttocks until you feel a gentle stretch in the front of your knee or thigh.  Hold for __________ seconds. Slowly slide your heel back to the starting position. Repeat __________ times. Complete this exercise __________ times per day.  STRETCH - Quadriceps, Prone   Lie on your stomach on a firm surface, such as a bed or padded floor.  Bend your right / left knee and grasp your ankle. If you are unable to reach your ankle or pant leg, use a belt around your foot to lengthen your reach.  Gently pull your heel toward your buttocks. Your knee should not slide out to the side. You should feel a stretch in the front of your thigh and/or knee.  Hold this position for __________ seconds. Repeat __________ times. Complete this stretch __________ times per day.  STRETCH - Hamstrings, Supine   Lie on your back. Loop a belt or towel over the ball of your right / left foot.  Straighten your right / left knee and slowly pull on the belt to raise your leg. Do not allow the right / left knee to bend. Keep your opposite leg flat on the floor.  Raise the leg until you feel a gentle stretch behind your right / left knee or thigh. Hold this position for __________ seconds. Repeat __________ times. Complete this stretch __________ times per day.  STRENGTHENING  EXERCISES These exercises may help you when beginning to rehabilitate your injury. They may resolve your symptoms with or without further involvement from your physician, physical therapist, or athletic trainer. While completing these exercises, remember:   Muscles can gain both the endurance and the strength needed for everyday activities through controlled exercises.  Complete these exercises as instructed by your physician, physical therapist, or athletic trainer. Progress the resistance and repetitions only as guided.  You may experience muscle soreness or fatigue, but the pain or discomfort you are trying  to eliminate should never worsen during these exercises. If this pain does worsen, stop and make certain you are following the directions exactly. If the pain is still present after adjustments, discontinue the exercise until you can discuss the trouble with your clinician. STRENGTH - Quadriceps, Isometrics  Lie on your back with your right / left leg extended and your opposite knee bent.  Gradually tense the muscles in the front of your right / left thigh. You should see either your knee cap slide up toward your hip or increased dimpling just above the knee. This motion will push the back of the knee down toward the floor/mat/bed on which you are lying.  Hold the muscle as tight as you can without increasing your pain for __________ seconds.  Relax the muscles slowly and completely in between each repetition. Repeat __________ times. Complete this exercise __________ times per day.  STRENGTH - Quadriceps, Short Arcs   Lie on your back. Place a __________ inch towel roll under your knee so that the knee slightly bends.  Raise only your lower leg by tightening the muscles in the front of your thigh. Do not allow your thigh to rise.  Hold this position for __________ seconds. Repeat __________ times. Complete this exercise __________ times per day.  OPTIONAL ANKLE WEIGHTS: Begin with  ____________________, but DO NOT exceed ____________________. Increase in 1 pound/0.5 kilogram increments.  STRENGTH - Quadriceps, Straight Leg Raises  Quality counts! Watch for signs that the quadriceps muscle is working to insure you are strengthening the correct muscles and not "cheating" by substituting with healthier muscles.  Lay on your back with your right / left leg extended and your opposite knee bent.  Tense the muscles in the front of your right / left thigh. You should see either your knee cap slide up or increased dimpling just above the knee. Your thigh may even quiver.  Tighten these muscles even more and raise your leg 4 to 6 inches off the floor. Hold for __________ seconds.  Keeping these muscles tense, lower your leg.  Relax the muscles slowly and completely in between each repetition. Repeat __________ times. Complete this exercise __________ times per day.  STRENGTH - Hamstring, Curls  Lay on your stomach with your legs extended. (If you lay on a bed, your feet may hang over the edge.)  Tighten the muscles in the back of your thigh to bend your right / left knee up to 90 degrees. Keep your hips flat on the bed/floor.  Hold this position for __________ seconds.  Slowly lower your leg back to the starting position. Repeat __________ times. Complete this exercise __________ times per day.  OPTIONAL ANKLE WEIGHTS: Begin with ____________________, but DO NOT exceed ____________________. Increase in 1 pound/0.5 kilogram increments.  STRENGTH - Quadriceps, Squats  Stand in a door frame so that your feet and knees are in line with the frame.  Use your hands for balance, not support, on the frame.  Slowly lower your weight, bending at the hips and knees. Keep your lower legs upright so that they are parallel with the door frame. Squat only within the range that does not increase your knee pain. Never let your hips drop below your knees.  Slowly return upright, pushing  with your legs, not pulling with your hands. Repeat __________ times. Complete this exercise __________ times per day.  STRENGTH - Quadriceps, Wall Slides  Follow guidelines for form closely. Increased knee pain often results from poorly placed feet or knees.  Lean against a smooth wall or door and walk your feet out 18-24 inches. Place your feet hip-width apart.  Slowly slide down the wall or door until your knees bend __________ degrees.* Keep your knees over your heels, not your toes, and in line with your hips, not falling to either side.  Hold for __________ seconds. Stand up to rest for __________ seconds in between each repetition. Repeat __________ times. Complete this exercise __________ times per day. * Your physician, physical therapist, or athletic trainer will alter this angle based on your symptoms and progress.   This information is not intended to replace advice given to you by your health care provider. Make sure you discuss any questions you have with your health care provider.   Document Released: 10/12/2005 Document Revised: 12/19/2014 Document Reviewed: 03/12/2009 Elsevier Interactive Patient Education [employer]

## 2015-10-04 NOTE — Addendum Note (Signed)
Addended by: Carmelina DaneANDERSON, Danylah Holden S on: 10/04/2015 11:36 AM   Modules accepted: Kipp BroodSmartSet

## 2015-10-04 NOTE — Progress Notes (Signed)
  Medical screening examination/treatment/procedure(s) were performed by non-physician practitioner and as supervising physician I was immediately available for consultation/collaboration.     

## 2015-10-07 ENCOUNTER — Telehealth: Payer: Self-pay

## 2015-10-07 DIAGNOSIS — K219 Gastro-esophageal reflux disease without esophagitis: Secondary | ICD-10-CM

## 2015-10-07 MED ORDER — PANTOPRAZOLE SODIUM 40 MG PO TBEC: 40.0000 mg | DELAYED_RELEASE_TABLET | Freq: Every day | ORAL | Status: DC

## 2015-10-07 NOTE — Telephone Encounter (Signed)
Please alert that I gave her the pantoprazole instead, and she should pick up from her pharmacy.

## 2015-10-07 NOTE — Telephone Encounter (Signed)
Prevacid solutabs is not covered by pt's ins. Judeth CornfieldStephanie, your notes say that pt failed prilosec, but I don't see if there is any problem with her swallowing pills or why you Rxd the solutabs. Even regular prevacid capsules are usually not covered without PA. If she can not swallow pills I can try a PA. If she can, you will need to change to probably pantoprazole since pt is not having relief with omeprazole (prilosec). Please advise.

## 2015-10-08 NOTE — Telephone Encounter (Signed)
Advised pt that we sent in the new Rx. She reported that she had already p/up the other medication. There was a little language barrier, but I asked her if the med she got was the packets that she mixed with water and she agreed and said she is feeling better after starting it. I explained that she can use either that or the pantoprazole next month if it is less expensive, but she should not use both. I explained very clearly until she stated that she understands.

## 2015-10-20 ENCOUNTER — Ambulatory Visit (INDEPENDENT_AMBULATORY_CARE_PROVIDER_SITE_OTHER): Payer: 59 | Admitting: Family Medicine

## 2015-10-20 ENCOUNTER — Ambulatory Visit (INDEPENDENT_AMBULATORY_CARE_PROVIDER_SITE_OTHER): Payer: 59

## 2015-10-20 VITALS — BP 112/74 | HR 86 | Temp 98.6°F | Resp 16 | Ht 62.0 in | Wt 115.4 lb

## 2015-10-20 DIAGNOSIS — R1084 Generalized abdominal pain: Secondary | ICD-10-CM | POA: Diagnosis not present

## 2015-10-20 DIAGNOSIS — K219 Gastro-esophageal reflux disease without esophagitis: Secondary | ICD-10-CM

## 2015-10-20 DIAGNOSIS — R1013 Epigastric pain: Secondary | ICD-10-CM

## 2015-10-20 DIAGNOSIS — R319 Hematuria, unspecified: Secondary | ICD-10-CM

## 2015-10-20 DIAGNOSIS — Z113 Encounter for screening for infections with a predominantly sexual mode of transmission: Secondary | ICD-10-CM | POA: Diagnosis not present

## 2015-10-20 LAB — POC MICROSCOPIC URINALYSIS (UMFC): Mucus: ABSENT

## 2015-10-20 LAB — POCT CBC
Granulocyte percent: 58 % (ref 37–80)
HCT, POC: 44.3 % (ref 37.7–47.9)
Hemoglobin: 15.2 g/dL (ref 12.2–16.2)
Lymph, poc: 2.7 (ref 0.6–3.4)
MCH, POC: 29.9 pg (ref 27–31.2)
MCHC: 34.3 g/dL (ref 31.8–35.4)
MCV: 87 fL (ref 80–97)
MID (cbc): 0.4 (ref 0–0.9)
MPV: 7.3 fL (ref 0–99.8)
POC Granulocyte: 4.2 (ref 2–6.9)
POC LYMPH PERCENT: 36.8 %L (ref 10–50)
POC MID %: 5.2 % (ref 0–12)
Platelet Count, POC: 265 10*3/uL (ref 142–424)
RBC: 5.09 M/uL (ref 4.04–5.48)
RDW, POC: 12.1 %
WBC: 7.3 10*3/uL (ref 4.6–10.2)

## 2015-10-20 LAB — POCT URINALYSIS DIP (MANUAL ENTRY)
Bilirubin, UA: NEGATIVE
Glucose, UA: NEGATIVE
Ketones, POC UA: NEGATIVE
Leukocytes, UA: NEGATIVE
Nitrite, UA: NEGATIVE
Protein Ur, POC: NEGATIVE
Spec Grav, UA: 1.015
Urobilinogen, UA: 0.2
pH, UA: 7.5

## 2015-10-20 MED ORDER — TRAMADOL HCL 50 MG PO TABS: 50.0000 mg | ORAL_TABLET | Freq: Two times a day (BID) | ORAL | Status: DC | PRN

## 2015-10-20 MED ORDER — GI COCKTAIL ~~LOC~~: 30.0000 mL | Freq: Once | ORAL | Status: AC

## 2015-10-20 NOTE — Progress Notes (Signed)
 Chief Complaint:  Chief Complaint  Patient presents with  . Abdominal Pain  . Emesis    HPI: Phyllis Morton is a 39 y.o. female who reports to [facility] today complaining of nausea after eating, feeling bloated, she was seen here on 10/03/15 and labs were normal.  She has had no fevers or chills. No hx of ovarian cysts, renal stones or constipation or SBO. She has tried to stay away from GERD inducing foods, she was given mobic for knee pain, this helped her knee , she denies it makingher stoamch worse,  she was also taking zantac before and it helped,  She also has some relief with the zantac and initially some releif with the PPI but now midepigastric pain, nausea has recurred. Her period is coming soon but otherwise no dysuria , no back pain, no hematuria No family hx of stomach cancers   Past Medical History  Diagnosis Date  . No pertinent past medical history   . Miscarriage 06/29/2012    LMP 04/10/2012   Past Surgical History  Procedure Laterality Date  . Cesarean section     Social History   Social History  . Marital Status: Married    Spouse Name: Jean Rosenthal [employer]  . Number of Children: 1  . Years of Education: 9   Occupational History  . NAIL TECHNICIAN    Social History Main Topics  . Smoking status: Never Smoker   . Smokeless tobacco: Never Used  . Alcohol Use: No  . Drug Use: No  . Sexual Activity:    Partners: Male    Birth Control/ Protection: None   Other Topics Concern  . None   Social History Narrative   From [place].  In the [place] since 2008.   Family History  Problem Relation Age of Onset  . Hypertension Mother    No Known Allergies Prior to Admission medications   Medication Sig Start Date End Date Taking? Authorizing Provider  lansoprazole (PREVACID SOLUTAB) 15 MG disintegrating tablet Take 1 tablet (15 mg total) by mouth daily at 12 noon. 10/03/15  Yes Collie Siad English, PA  omeprazole (PRILOSEC) 20 MG capsule Take 20 mg by mouth daily.    Yes Historical Provider, MD  pantoprazole (PROTONIX) 40 MG tablet Take 1 tablet (40 mg total) by mouth daily. 10/07/15  Yes Collie Siad English, PA  ranitidine (ZANTAC) 150 MG capsule Take 1 capsule (150 mg total) by mouth 2 (two) times daily. 10/03/15  Yes Stephanie D English, PA  ipratropium (ATROVENT) 0.03 % nasal spray Place 2 sprays into the nose 2 (two) times daily. 08/20/12 08/20/13  Chelle Jeffery, PA-C  meloxicam (MOBIC) 7.5 MG tablet Take 1 tablet (7.5 mg total) by mouth daily. Patient not taking: Reported on 10/20/2015 10/03/15   Collie Siad English, PA     ROS: The patient denies fevers, chills, night sweats, unintentional weight loss, chest pain, palpitations, wheezing, dyspnea on exertion, dysuria, hematuria, melena, numbness, weakness, or tingling.  All other systems have been reviewed and were otherwise negative with the exception of those mentioned in the HPI and as above.    PHYSICAL EXAM: Filed Vitals:   10/20/15 1015  BP: 112/74  Pulse: 86  Temp: 98.6 F (37 C)  Resp: 16   Body mass index is 21.1 kg/(m^2).   General: Alert, no acute distress HEENT:  Normocephalic, atraumatic, oropharynx patent. EOMI, PERRLA Cardiovascular:  Regular rate and rhythm, no rubs murmurs or gallops.  No Carotid bruits,  radial pulse intact. No pedal edema.  Respiratory: Clear to auscultation bilaterally.  No wheezes, rales, or rhonchi.  No cyanosis, no use of accessory musculature Abdominal: No organomegaly, abdomen is soft and minimally tender mid epigastric, positive bowel sounds. No masses. Skin: No rashes. Neurologic: Facial musculature symmetric. Psychiatric: Patient acts appropriately throughout our interaction. Lymphatic: No cervical or submandibular lymphadenopathy Musculoskeletal: Gait intact. No edema, tenderness   LABS: Results for orders placed or performed in visit on 10/20/15  POCT urinalysis dipstick  Result Value Ref Range   Color, UA yellow yellow   Clarity, UA clear  clear   Glucose, UA negative negative   Bilirubin, UA negative negative   Ketones, POC UA negative negative   Spec Grav, UA 1.015    Blood, UA large (A) negative   pH, UA 7.5    Protein Ur, POC negative negative   Urobilinogen, UA 0.2    Nitrite, UA Negative Negative   ukocytes, UA Negative Negative  POCT Microscopic Urinalysis ([facility])  Result Value Ref Range   WBC,UR,HPF,POC None None WBC/hpf   RBC,UR,HPF,POC Few (A) None RBC/hpf   Bacteria Moderate (A) None, Too numerous to count   Mucus Absent Absent   Epithelial Cells, UR Per Microscopy Few (A) None, Too numerous to count cells/hpf  POCT CBC  Result Value Ref Range   WBC 7.3 4.6 - 10.2 K/uL   Lymph, poc 2.7 0.6 - 3.4   POC LYMPH PERCENT 36.8 10 - 50 %L   MID (cbc) 0.4 0 - 0.9   POC MID % 5.2 0 - 12 %M   POC Granulocyte 4.2 2 - 6.9   Granulocyte percent 58.0 37 - 80 %G   RBC 5.09 4.04 - 5.48 M/uL   Hemoglobin 15.2 12.2 - 16.2 g/dL   HCT, POC 40.944.3 81.137.7 - 47.9 %   MCV 87.0 80 - 97 fL   MCH, POC 29.9 27 - 31.2 pg   MCHC 34.3 31.8 - 35.4 g/dL   RDW, POC 91.412.1 %   Platelet Count, POC 265 142 - 424 K/uL   MPV 7.3 0 - 99.8 fL     EKG/XRAY:   [facility] Pase comment if she has ureteral stones vs phleboliths, abd pain has migrated from epigastric area to lower abd.    ASSESSMENT/PLAN: Encounter Diagnoses  Name Primary?  . Gastroesophageal reflux disease, esophagitis presence not specified Yes  . Midepigastric pain   . Hematuria   . Screening for STD (sexually transmitted disease)    GI cocktail Continue with PPI  I will get labs and also get H pylori Will not do anything for hematuria, patient is about to start her cycle.  Fu prn   Gross sideeffects, risk and benefits, and alternatives of medications d/w patient. Patient is aware that all medications have potential sideeffects and we are unable to predict every sideeffect or drug-drug interaction that may occur.    DO    10/20/2015 12:01 PM

## 2015-10-20 NOTE — Patient Instructions (Signed)
?  au b?ng, Ng??i l?n  (Abdominal Pain, Adult)  Có nhi?u nguyên nhân d?n ??n ?au b?ng. Thông th??ng ?au b?ng là do m?t b?nh gây ra và s? không ?? n?u không ?i?u tr?. B?nh này có th? ???c theo dõi và ?i?u tr? t?i nhà. Chuyên gia ch?m sóc s?c kh?e s? ti?n hành khám th?c th? và có th? yêu c?u làm xét nghi?m máu và ch?p X quang ?? xác ??nh m?c ?? nghiêm tr?ng c?a c?n ?au b?ng. Tuy nhiên, trong nhi?u tr??ng h?p, ph?i m?t nhi?u th?i gian h?n ?? xác ??nh rõ nguyên nhân gây ?au b?ng. Tr??c khi tìm ra nguyên nhân, chuyên gia ch?m sóc s?c kh?e có th? không bi?t li?u quý v? có c?n làm thêm xét nghi?m ho?c ti?p t?c ?i?u tr? hay không.  H??NG D?N CH?M SÓC T?I NHÀ   Theo dõi c?n ?au b?ng xem có b?t k? thay ??i nào không. Nh?ng hành ??ng sau có th? giúp lo?i b? b?t c? c?m giác khó ch?u nào quý v? ?ang b?.  · Ch? s? d?ng thu?c không c?n kê ??n ho?c thu?c c?n kê ??n theo ch? d?n c?a chuyên gia ch?m sóc s?c kh?e.  · Không dùng thu?c nhu?n tràng tr? khi ???c chuyên gia ch?m sóc s?c kh?e ch? ??nh.  · Th? dùng m?t ch? ?? ?n l?ng (n??c lu?c th?t, trà, ho?c n??c) theo ch? d?n c?a chuyên gia ch?m sóc s?c kh?e. Chuy?n d?n sang m?t ch? ?? ?n nh? n?u ch?u ???c.  ?I KHÁM N?U:  · Quý v? b? ?au b?ng không rõ nguyên nhân.  · Quý v? b? ?au b?ng kèm theo bu?n nôn ho?c tiêu ch?y.  · Quý v? b? ?au khi ?i ti?u ho?c ?i ngoài.  · Quý v? b? c?n ?au b?ng làm th?c gi?c vào ban ?êm.  · Quý v? b? ?au b?ng n?ng thêm ho?c ?? h?n khi ?n.  · Quý v? b? ?au b?ng n?ng thêm khi ?n ?? ?n nhi?u ch?t béo.  · Quý v? b? s?t.  NGAY L?P T?C ?I KHÁM N?U:   · C?n ?au không kh?i trong vòng 2 gi?.  · Quý v? v?n ti?p t?c b? ói (nôn m?a).  · Ch? có th? c?m th?y ?au ? m?t s? ph?n b?ng, ch?ng h?n nh? ? ph?n b?ng bên ph?i ho?c bên d??i trái.  · Phân c?a quý v? có máu ho?c có màu ?en nh? h?c ín.  ??M B?O QUÝ V?:  · Hi?u rõ các h??ng d?n này.  · S? theo dõi tình tr?ng c?a mình.  · S? yêu c?u tr? giúp ngay l?p t?c n?u quý v? c?m th?y không kh?e ho?c th?y tr?m tr?ng h?n.     Thông tin  này không nh?m m?c ?ích thay th? cho l?i khuyên mà chuyên gia ch?m sóc s?c kh?e nói v?i quý v?. Hãy b?o ??m quý v? ph?i th?o lu?n b?t k? v?n ?? gì mà quý v? có v?i chuyên gia ch?m sóc s?c kh?e c?a quý v?.     Document Released: 11/28/2005 Document Revised: 12/19/2014  Elsevier Interactive Patient Education ©2016 [employer] Inc.

## 2015-10-21 LAB — H. PYLORI BREATH TEST: H. pylori Breath Test: DETECTED — AB

## 2015-10-21 LAB — GC/CHLAMYDIA PROBE AMP
CT Probe RNA: NEGATIVE
GC Probe RNA: NEGATIVE

## 2015-10-22 ENCOUNTER — Telehealth: Payer: Self-pay | Admitting: Family Medicine

## 2015-10-22 MED ORDER — AMOXICILL-CLARITHRO-LANSOPRAZ PO MISC: Freq: Two times a day (BID) | ORAL | Status: DC

## 2015-10-22 NOTE — Telephone Encounter (Signed)
h pylori  +, spoke with patient, will rx prevpac, she will stop all other meds, other labs d/w patient

## 2015-10-24 ENCOUNTER — Other Ambulatory Visit: Payer: Self-pay | Admitting: Family Medicine

## 2015-10-24 MED ORDER — AMOXICILLIN 500 MG PO CAPS: 1000.0000 mg | ORAL_CAPSULE | Freq: Two times a day (BID) | ORAL | Status: DC

## 2015-10-24 MED ORDER — CLARITHROMYCIN 500 MG PO TABS: 500.0000 mg | ORAL_TABLET | Freq: Two times a day (BID) | ORAL | Status: DC

## 2015-10-24 MED ORDER — LANSOPRAZOLE 30 MG PO CPDR: 30.0000 mg | DELAYED_RELEASE_CAPSULE | Freq: Two times a day (BID) | ORAL | Status: DC

## 2015-10-26 ENCOUNTER — Telehealth: Payer: Self-pay

## 2015-10-26 NOTE — Telephone Encounter (Signed)
Looks like a PA for Prevpac was sent. Dr. Conley RollsLe sent in medications seperately to be covered by insurance.

## 2015-11-08 ENCOUNTER — Other Ambulatory Visit: Payer: Self-pay | Admitting: Physician Assistant

## 2015-12-10 DIAGNOSIS — R1013 Epigastric pain: Secondary | ICD-10-CM | POA: Diagnosis present

## 2015-12-10 DIAGNOSIS — Z8619 Personal history of other infectious and parasitic diseases: Secondary | ICD-10-CM | POA: Insufficient documentation

## 2015-12-10 DIAGNOSIS — Z79899 Other long term (current) drug therapy: Secondary | ICD-10-CM | POA: Diagnosis not present

## 2015-12-10 DIAGNOSIS — K2970 Gastritis, unspecified, without bleeding: Secondary | ICD-10-CM | POA: Insufficient documentation

## 2015-12-10 DIAGNOSIS — Z792 Long term (current) use of antibiotics: Secondary | ICD-10-CM | POA: Diagnosis not present

## 2015-12-11 ENCOUNTER — Emergency Department (HOSPITAL_BASED_OUTPATIENT_CLINIC_OR_DEPARTMENT_OTHER)
Admission: EM | Admit: 2015-12-11 | Discharge: 2015-12-11 | Disposition: A | Discharge status: Home / Self Care | Payer: 59 | Attending: Emergency Medicine | Admitting: Emergency Medicine

## 2015-12-11 ENCOUNTER — Encounter (HOSPITAL_BASED_OUTPATIENT_CLINIC_OR_DEPARTMENT_OTHER): Payer: Self-pay | Admitting: *Deleted

## 2015-12-11 DIAGNOSIS — K2970 Gastritis, unspecified, without bleeding: Secondary | ICD-10-CM

## 2015-12-11 HISTORY — DX: Gastritis, unspecified, without bleeding: K29.70

## 2015-12-11 HISTORY — DX: Helicobacter pylori (H. pylori) as the cause of diseases classified elsewhere: B96.81

## 2015-12-11 LAB — CBC WITH DIFFERENTIAL/PLATELET
BASOS ABS: 0 10*3/uL (ref 0.0–0.1)
BASOS PCT: 0 %
Eosinophils Absolute: 0.1 10*3/uL (ref 0.0–0.7)
Eosinophils Relative: 1 %
HEMATOCRIT: 40.1 % (ref 36.0–46.0)
Hemoglobin: 13.4 g/dL (ref 12.0–15.0)
Lymphocytes Relative: 32 %
Lymphs Abs: 3.1 10*3/uL (ref 0.7–4.0)
MCH: 28.9 pg (ref 26.0–34.0)
MCHC: 33.4 g/dL (ref 30.0–36.0)
MCV: 86.4 fL (ref 78.0–100.0)
MONO ABS: 0.9 10*3/uL (ref 0.1–1.0)
Monocytes Relative: 9 %
NEUTROS ABS: 5.7 10*3/uL (ref 1.7–7.7)
NEUTROS PCT: 58 %
Platelets: 274 10*3/uL (ref 150–400)
RBC: 4.64 MIL/uL (ref 3.87–5.11)
RDW: 11.8 % (ref 11.5–15.5)
WBC: 9.8 10*3/uL (ref 4.0–10.5)

## 2015-12-11 LAB — COMPREHENSIVE METABOLIC PANEL
ALK PHOS: 57 U/L (ref 38–126)
ALT: 12 U/L — ABNORMAL LOW (ref 14–54)
ANION GAP: 8 (ref 5–15)
AST: 22 U/L (ref 15–41)
Albumin: 4.3 g/dL (ref 3.5–5.0)
BILIRUBIN TOTAL: 0.3 mg/dL (ref 0.3–1.2)
BUN: 16 mg/dL (ref 6–20)
CALCIUM: 9.4 mg/dL (ref 8.9–10.3)
CO2: 24 mmol/L (ref 22–32)
Chloride: 106 mmol/L (ref 101–111)
Creatinine, Ser: 0.62 mg/dL (ref 0.44–1.00)
Glucose, Bld: 96 mg/dL (ref 65–99)
POTASSIUM: 3.5 mmol/L (ref 3.5–5.1)
Sodium: 138 mmol/L (ref 135–145)
TOTAL PROTEIN: 7.9 g/dL (ref 6.5–8.1)

## 2015-12-11 LAB — LIPASE, BLOOD: Lipase: 38 U/L (ref 11–51)

## 2015-12-11 MED ORDER — ONDANSETRON HCL 4 MG/2ML IJ SOLN
4.0000 mg | Freq: Once | INTRAMUSCULAR | Status: AC
  Administered 2015-12-11: 4 mg via INTRAVENOUS
  Filled 2015-12-11: qty 2

## 2015-12-11 MED ORDER — SUCRALFATE 1 G PO TABS: 1.0000 g | ORAL_TABLET | Freq: Three times a day (TID) | ORAL | Status: DC

## 2015-12-11 MED ORDER — SUCRALFATE 1 G PO TABS
1.0000 g | ORAL_TABLET | Freq: Once | ORAL | Status: AC
  Administered 2015-12-11: 1 g via ORAL
  Filled 2015-12-11: qty 1

## 2015-12-11 NOTE — ED Notes (Signed)
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 C/o upper mid abd pain, similar to before. Finished H.Pylori prevpac. "Felt better. Was not instructed".  Pain and nausea returned tonight. (Denies: fever, vd or other sx), [facility]

## 2015-12-11 NOTE — ED Notes (Signed)
Pt back from b/r.  Urine sample saved.  No changes, alert, NAD, calm, interactive.  [facility] [employer] Language line with pt.  Family present.

## 2015-12-11 NOTE — ED Notes (Signed)
RN Shimshon Narula triaged this Pt. Not RN Sharon Reynolds. 

## 2015-12-11 NOTE — ED Notes (Signed)
Pt. Reports she was placed on medicine for this same pain 2 months ago and was placed on meds.  No pain till 2 days ago.  Pt. Reports nausea with no vomiting.  Pt. Reports severe burning and points to epigastric area saying heavy pain. Pt. Was placed on Previcid and Pantoprazole by urgent care per Pt.

## 2015-12-11 NOTE — ED Provider Notes (Signed)
CSN: [id number]     Arrival date & time 12/10/15  2349 History   First MD Initiated Contact with Patient 12/11/15 0232     Chief Complaint  Patient presents with  . Abdominal Pain     (Consider location/radiation/quality/duration/timing/severity/associated sxs/prior Treatment) HPI  This is a 39 year old female with a history of Helicobacter pylori gastritis treated with Prevpac about 2 months ago. Her symptoms had resolved until 2 days ago when she redeveloped epigastric pain radiating to the entire abdomen. She describes the pain as severe and burning. It is worse with movement and deep breathing. She is having nausea but no vomiting or diarrhea. She is currently on Protonix and Zantac.  Past Medical History  Diagnosis Date  . Miscarriage 06/29/2012    LMP 04/10/2012  . Helicobacter pylori gastritis    Past Surgical History  Procedure Laterality Date  . Cesarean section     Family History  Problem Relation Age of Onset  . Hypertension Mother    Social History  Substance Use Topics  . Smoking status: Never Smoker   . Smokeless tobacco: Never Used  . Alcohol Use: No   OB History    Gravida Para Term Preterm AB TAB SAB Ectopic Multiple Living   2 1 1       1      Review of Systems  All other systems reviewed and are negative.   Allergies  Review of patient's allergies indicates no known allergies.  Home Medications   Prior to Admission medications   Medication Sig Start Date End Date Taking? Authorizing Provider  amoxicillin (AMOXIL) 500 MG capsule Take 2 capsules (1,000 mg total) by mouth 2 (two) times daily. 10/24/15   Thao P Le, DO  clarithromycin (BIAXIN) 500 MG tablet Take 1 tablet (500 mg total) by mouth 2 (two) times daily. 10/24/15   Thao P Le, DO  ipratropium (ATROVENT) 0.03 % nasal spray Place 2 sprays into the nose 2 (two) times daily. 08/20/12 08/20/13  Chelle Jeffery, PA-C  lansoprazole (PREVACID) 30 MG capsule Take 1 capsule (30 mg total) by mouth 2 (two)  times daily before a meal. 10/24/15   Thao P Le, DO  pantoprazole (PROTONIX) 40 MG tablet TAKE 1 TABLET (40 MG)  BY MOUTH DAILY 11/09/15   Collie SiadStephanie D English, PA  traMADol (ULTRAM) 50 MG tablet Take 1 tablet (50 mg total) by mouth every 12 (twelve) hours as needed for severe pain. May cause constipation 10/20/15   Thao P Le, DO   BP 99/72 mmHg  Pulse 71  Temp(Src) 98 F (36.7 C) (Oral)  Resp 16  Ht 5\' 4"  (1.626 m)  Wt 115 lb (52.164 kg)  BMI 19.73 kg/m2  SpO2 100%  LMP 11/16/2015   Physical Exam  General: Well-developed, well-nourished female in no acute distress; appearance consistent with age of record HENT: normocephalic; atraumatic Eyes: pupils equal, round and reactive to light; extraocular muscles intact Neck: supple Heart: regular rate and rhythm Lungs: clear to auscultation bilaterally Abdomen: soft; nondistended; diffusely tender most prominent in the epigastrium; no masses or hepatosplenomegaly; bowel sounds present Extremities: No deformity; full range of motion; pulses normal Neurologic: Awake, alert and oriented; motor function intact in all extremities and symmetric; no facial droop Skin: Warm and dry Psychiatric: Normal mood and affect    ED Course  Procedures (including critical care time)   EKG Interpretation   Date/Time:  Friday December 11 2015 01:04:47 EST Ventricular Rate:  72 PR Interval:  146 QRS Duration: 90  QT Interval:  393 QTC Calculation: 430 R Axis:   97 Text Interpretation:  Sinus rhythm Borderline right axis deviation No  previous ECGs available Confirmed by Danilynn Jemison  MD, Jonny Ruiz ([id number]) on  12/11/2015 2:18:33 AM      MDM  Nursing notes and vitals signs, including pulse oximetry, reviewed.  Summary of this visit's results, reviewed by myself:  Labs:  Results for orders placed or performed during the hospital encounter of 12/11/15 (from the past 24 hour(s))  Lipase, blood     Status: None   Collection Time: 12/11/15  3:03 AM  Result  Value Ref Range   Lipase 38 11 - 51 U/L  Comprehensive metabolic panel     Status: Abnormal   Collection Time: 12/11/15  3:03 AM  Result Value Ref Range   Sodium 138 135 - 145 mmol/L   Potassium 3.5 3.5 - 5.1 mmol/L   Chloride 106 101 - 111 mmol/L   CO2 24 22 - 32 mmol/L   Glucose, Bld 96 65 - 99 mg/dL   BUN 16 6 - 20 mg/dL   Creatinine, Ser 1.91 0.44 - 1.00 mg/dL   Calcium 9.4 8.9 - 47.8 mg/dL   Total Protein 7.9 6.5 - 8.1 g/dL   Albumin 4.3 3.5 - 5.0 g/dL   AST 22 15 - 41 U/L   ALT 12 (L) 14 - 54 U/L   Alkaline Phosphatase 57 38 - 126 U/L   Total Bilirubin 0.3 0.3 - 1.2 mg/dL   GFR calc non Af Amer >60 >60 mL/min   GFR calc Af Amer >60 >60 mL/min   Anion gap 8 5 - 15  CBC with Differential/Platelet     Status: None   Collection Time: 12/11/15  3:03 AM  Result Value Ref Range   WBC 9.8 4.0 - 10.5 K/uL   RBC 4.64 3.87 - 5.11 MIL/uL   Hemoglobin 13.4 12.0 - 15.0 g/dL   HCT 29.5 62.1 - 30.8 %   MCV 86.4 78.0 - 100.0 fL   MCH 28.9 26.0 - 34.0 pg   MCHC 33.4 30.0 - 36.0 g/dL   RDW 65.7 84.6 - 96.2 %   Platelets 274 150 - 400 K/uL   Neutrophils Relative % 58 %   Neutro Abs 5.7 1.7 - 7.7 K/uL   Lymphocytes Relative 32 %   Lymphs Abs 3.1 0.7 - 4.0 K/uL   Monocytes Relative 9 %   Monocytes Absolute 0.9 0.1 - 1.0 K/uL   Eosinophils Relative 1 %   Eosinophils Absolute 0.1 0.0 - 0.7 K/uL   Basophils Relative 0 %   Basophils Absolute 0.0 0.0 - 0.1 K/uL   3:48 AM Patient feeling better after IV Zofran and Carafate by mouth. We will have her continue the Protonix and Zantac but add Carafate and refer to gastroenterology.     Paula Libra, MD 12/11/15 [phone]

## 2015-12-15 ENCOUNTER — Encounter (HOSPITAL_BASED_OUTPATIENT_CLINIC_OR_DEPARTMENT_OTHER): Payer: Self-pay | Admitting: *Deleted

## 2015-12-15 DIAGNOSIS — Z79899 Other long term (current) drug therapy: Secondary | ICD-10-CM | POA: Diagnosis not present

## 2015-12-15 DIAGNOSIS — R1013 Epigastric pain: Secondary | ICD-10-CM | POA: Insufficient documentation

## 2015-12-15 DIAGNOSIS — Z792 Long term (current) use of antibiotics: Secondary | ICD-10-CM | POA: Insufficient documentation

## 2015-12-15 DIAGNOSIS — Z8619 Personal history of other infectious and parasitic diseases: Secondary | ICD-10-CM | POA: Diagnosis not present

## 2015-12-15 NOTE — ED Notes (Signed)
Abdominal pain. Right upper quadrant pain x 5 days. Pain goes into her back.

## 2015-12-16 ENCOUNTER — Emergency Department (HOSPITAL_BASED_OUTPATIENT_CLINIC_OR_DEPARTMENT_OTHER): Payer: BLUE CROSS/BLUE SHIELD

## 2015-12-16 ENCOUNTER — Emergency Department (HOSPITAL_BASED_OUTPATIENT_CLINIC_OR_DEPARTMENT_OTHER)
Admission: EM | Admit: 2015-12-16 | Discharge: 2015-12-16 | Disposition: A | Discharge status: Home / Self Care | Payer: BLUE CROSS/BLUE SHIELD | Attending: Emergency Medicine | Admitting: Emergency Medicine

## 2015-12-16 DIAGNOSIS — R1013 Epigastric pain: Secondary | ICD-10-CM

## 2015-12-16 LAB — URINALYSIS, ROUTINE W REFLEX MICROSCOPIC
BILIRUBIN URINE: NEGATIVE
GLUCOSE, UA: NEGATIVE mg/dL
KETONES UR: NEGATIVE mg/dL
Leukocytes, UA: NEGATIVE
Nitrite: NEGATIVE
PH: 6.5 (ref 5.0–8.0)
Protein, ur: NEGATIVE mg/dL
Specific Gravity, Urine: 1.011 (ref 1.005–1.030)

## 2015-12-16 LAB — URINE MICROSCOPIC-ADD ON

## 2015-12-16 LAB — LIPASE, BLOOD: LIPASE: 26 U/L (ref 11–51)

## 2015-12-16 LAB — COMPREHENSIVE METABOLIC PANEL
ALBUMIN: 4.1 g/dL (ref 3.5–5.0)
ALT: 11 U/L — ABNORMAL LOW (ref 14–54)
ANION GAP: 7 (ref 5–15)
AST: 21 U/L (ref 15–41)
Alkaline Phosphatase: 48 U/L (ref 38–126)
BILIRUBIN TOTAL: 0.3 mg/dL (ref 0.3–1.2)
BUN: 13 mg/dL (ref 6–20)
CO2: 26 mmol/L (ref 22–32)
Calcium: 9.1 mg/dL (ref 8.9–10.3)
Chloride: 108 mmol/L (ref 101–111)
Creatinine, Ser: 0.54 mg/dL (ref 0.44–1.00)
GFR calc Af Amer: >60 mL/min (ref >60)
GLUCOSE: 96 mg/dL (ref 65–99)
POTASSIUM: 3.2 mmol/L — AB (ref 3.5–5.1)
Sodium: 141 mmol/L (ref 135–145)
TOTAL PROTEIN: 7.3 g/dL (ref 6.5–8.1)

## 2015-12-16 LAB — CBC WITH DIFFERENTIAL/PLATELET
BASOS PCT: 0 %
Basophils Absolute: 0 10*3/uL (ref 0.0–0.1)
EOS PCT: 1 %
Eosinophils Absolute: 0.1 10*3/uL (ref 0.0–0.7)
HEMATOCRIT: 38 % (ref 36.0–46.0)
Hemoglobin: 12.6 g/dL (ref 12.0–15.0)
Lymphocytes Relative: 32 %
Lymphs Abs: 2.9 10*3/uL (ref 0.7–4.0)
MCH: 28.8 pg (ref 26.0–34.0)
MCHC: 33.2 g/dL (ref 30.0–36.0)
MCV: 87 fL (ref 78.0–100.0)
MONO ABS: 0.8 10*3/uL (ref 0.1–1.0)
MONOS PCT: 9 %
NEUTROS ABS: 5.2 10*3/uL (ref 1.7–7.7)
Neutrophils Relative %: 58 %
PLATELETS: 260 10*3/uL (ref 150–400)
RBC: 4.37 MIL/uL (ref 3.87–5.11)
RDW: 11.4 % — AB (ref 11.5–15.5)
WBC: 9 10*3/uL (ref 4.0–10.5)

## 2015-12-16 MED ORDER — IOHEXOL 300 MG/ML  SOLN
25.0000 mL | Freq: Once | INTRAMUSCULAR | Status: AC | PRN
  Administered 2015-12-16: 25 mL via ORAL

## 2015-12-16 MED ORDER — SODIUM CHLORIDE 0.9 % IV BOLUS (SEPSIS)
1000.0000 mL | Freq: Once | INTRAVENOUS | Status: AC
  Administered 2015-12-16: 1000 mL via INTRAVENOUS

## 2015-12-16 MED ORDER — MORPHINE SULFATE (PF) 4 MG/ML IV SOLN
4.0000 mg | Freq: Once | INTRAVENOUS | Status: AC
  Administered 2015-12-16: 4 mg via INTRAVENOUS
  Filled 2015-12-16: qty 1

## 2015-12-16 MED ORDER — IOHEXOL 300 MG/ML  SOLN
100.0000 mL | Freq: Once | INTRAMUSCULAR | Status: AC | PRN
  Administered 2015-12-16: 100 mL via INTRAVENOUS

## 2015-12-16 MED ORDER — GI COCKTAIL ~~LOC~~
30.0000 mL | Freq: Once | ORAL | Status: AC
  Administered 2015-12-16: 30 mL via ORAL
  Filled 2015-12-16: qty 30

## 2015-12-16 MED ORDER — SUCRALFATE 1 G PO TABS
1.0000 g | ORAL_TABLET | Freq: Once | ORAL | Status: AC
  Administered 2015-12-16: 1 g via ORAL
  Filled 2015-12-16: qty 1

## 2015-12-16 MED ORDER — ONDANSETRON HCL 4 MG/2ML IJ SOLN
4.0000 mg | Freq: Once | INTRAMUSCULAR | Status: AC
  Administered 2015-12-16: 4 mg via INTRAVENOUS
  Filled 2015-12-16: qty 2

## 2015-12-16 NOTE — Discharge Instructions (Signed)

## 2015-12-16 NOTE — ED Provider Notes (Signed)
CSN: [id number]     Arrival date & time 12/15/15  2143 History   First MD Initiated Contact with Patient 12/16/15 0159     Chief Complaint  Patient presents with  . Abdominal Pain     (Consider location/radiation/quality/duration/timing/severity/associated sxs/prior Treatment) HPI   This is a 40 year old female with history of H. Pylori seen and evaluated several days ago for epigastric pain who returns with recurrent epigastric pain. Patient reports persistent symptoms over the last 5 days. She reports epigastric and right upper quadrant abdominal pain. It is sharp and radiates to her back. Patient states it is there all the time. Nothing makes it better or worse. She has been taking her medications at home with minimal relief. Currently she rates her pain at 10 out of 10. Denies any fevers. She was discharged previously on a PPI and Carafate. She has follow-up with gastroenterology on January 23rd.  Past Medical History  Diagnosis Date  . Miscarriage 06/29/2012    LMP 04/10/2012  . Helicobacter pylori gastritis    Past Surgical History  Procedure Laterality Date  . Cesarean section     Family History  Problem Relation Age of Onset  . Hypertension Mother    Social History  Substance Use Topics  . Smoking status: Never Smoker   . Smokeless tobacco: Never Used  . Alcohol Use: No   OB History    Gravida Para Term Preterm AB TAB SAB Ectopic Multiple Living   2 1 1       1      Review of Systems  Constitutional: Negative for fever.  Respiratory: Negative for chest tightness and shortness of breath.   Cardiovascular: Negative for chest pain.  Gastrointestinal: Positive for abdominal pain. Negative for nausea and vomiting.  Genitourinary: Negative for dysuria.  All other systems reviewed and are negative.     Allergies  Review of patient's allergies indicates no known allergies.  Home Medications   Prior to Admission medications   Medication Sig Start Date End Date  Taking? Authorizing Provider  amoxicillin (AMOXIL) 500 MG capsule Take 2 capsules (1,000 mg total) by mouth 2 (two) times daily. 10/24/15   Thao P Le, DO  clarithromycin (BIAXIN) 500 MG tablet Take 1 tablet (500 mg total) by mouth 2 (two) times daily. 10/24/15   Thao P Le, DO  ipratropium (ATROVENT) 0.03 % nasal spray Place 2 sprays into the nose 2 (two) times daily. 08/20/12 08/20/13  Chelle Jeffery, PA-C  lansoprazole (PREVACID) 30 MG capsule Take 1 capsule (30 mg total) by mouth 2 (two) times daily before a meal. 10/24/15   Thao P Le, DO  pantoprazole (PROTONIX) 40 MG tablet TAKE 1 TABLET (40 MG)  BY MOUTH DAILY 11/09/15   Collie Siad English, PA  sucralfate (CARAFATE) 1 g tablet Take 1 tablet (1 g total) by mouth 4 (four) times daily -  with meals and at bedtime. 12/11/15   John Molpus, MD  traMADol (ULTRAM) 50 MG tablet Take 1 tablet (50 mg total) by mouth every 12 (twelve) hours as needed for severe pain. May cause constipation 10/20/15   Thao P Le, DO   BP 110/75 mmHg  Pulse 70  Temp(Src) 97.9 F (36.6 C) (Oral)  Resp 19  Ht 5\' 4"  (1.626 m)  Wt 115 lb (52.164 kg)  BMI 19.73 kg/m2  SpO2 100%  LMP 11/16/2015 Physical Exam  Constitutional: She is oriented to person, place, and time. She appears well-developed and well-nourished. No distress.  HENT:  Head:  Normocephalic and atraumatic.  Cardiovascular: Normal rate, regular rhythm and normal heart sounds.   No murmur heard. Pulmonary/Chest: Effort normal and breath sounds normal. No respiratory distress. She has no wheezes.  Abdominal: Soft. Bowel sounds are normal. There is tenderness. There is no rebound and no guarding.   Epigastric tenderness to palpation without rebound or guarding  Neurological: She is alert and oriented to person, place, and time.  Skin: Skin is warm and dry.  Psychiatric: She has a normal mood and affect.  Nursing note and vitals reviewed.   ED Course  Procedures (including critical care time) Labs  Review Labs Reviewed  URINALYSIS, ROUTINE W REFLEX MICROSCOPIC (NOT AT [facility]) - Abnormal; Notable for the following:    Hgb urine dipstick LARGE (*)    All other components within normal limits  CBC WITH DIFFERENTIAL/PLATELET - Abnormal; Notable for the following:    RDW 11.4 (*)    All other components within normal limits  COMPREHENSIVE METABOLIC PANEL - Abnormal; Notable for the following:    Potassium 3.2 (*)    ALT 11 (*)    All other components within normal limits  URINE MICROSCOPIC-ADD ON - Abnormal; Notable for the following:    Squamous Epithelial / LPF 0-5 (*)    Bacteria, UA RARE (*)    All other components within normal limits  LIPASE, BLOOD    Imaging Review Ct Abdomen Pelvis W Contrast  12/16/2015  CLINICAL DATA:  Acute onset of right upper quadrant abdominal pain and back pain. Initial encounter. EXAM: CT ABDOMEN AND PELVIS WITH CONTRAST TECHNIQUE: Multidetector CT imaging of the abdomen and pelvis was performed using the standard protocol following bolus administration of intravenous contrast. CONTRAST:  OMNIPAQUE IOHEXOL 300 MG/ML  SOLN COMPARISON:  Abdominal radiograph performed 10/20/2015, and pelvic ultrasound performed 06/29/2012 FINDINGS: The visualized lung bases are clear. The liver and spleen are unremarkable in appearance. The gallbladder is within normal limits. The pancreas and adrenal glands are unremarkable. The kidneys are unremarkable in appearance. There is no evidence of hydronephrosis. No renal or ureteral stones are seen. No perinephric stranding is appreciated. No free fluid is identified. The small bowel is unremarkable in appearance. The stomach is within normal limits. No acute vascular abnormalities are seen. The appendix is normal in caliber, without evidence of appendicitis. The colon is unremarkable in appearance. The bladder is mildly distended and grossly unremarkable. The uterus is grossly unremarkable in appearance. The ovaries are relatively  symmetric. No suspicious adnexal masses are seen. No inguinal lymphadenopathy is seen. No acute osseous abnormalities are identified. IMPRESSION: Unremarkable contrast-enhanced CT of the abdomen and pelvis. Electronically Signed   By: Roanna Raider M.D.   On: 12/16/2015 04:56   I have personally reviewed and evaluated these images and lab results as part of my medical decision-making.   EKG Interpretation None      MDM   Final diagnoses:  Epigastric pain     patient presents with persistent epigastric pain. Nontoxic on exam. Afebrile. History of gastritis and is currently being treated for the same. Has follow-up with GI. She is afebrile and vital signs are reassuring. She does have tenderness to palpation in the epigastrium without rebound or guarding. Basic lab work including LFTs are reassuring. She was given pain and nausea medication as well as Carafate. She reports persistent symptoms. She was subsequently given a GI cocktail and a CT scan of the abdomen was obtained to evaluate for possible pancreatic or gallbladder abnormalities. CT scan is  normal.  Patient needs an endoscopy for further evaluation. This is already been scheduled. Discussed with patient continued supportive measures at home.  After history, exam, and medical workup I feel the patient has been appropriately medically screened and is safe for discharge home. Pertinent diagnoses were discussed with the patient. Patient was given return precautions.     Shon Batonourtney F Horton, MD 12/16/15 [phone]

## 2016-01-07 ENCOUNTER — Other Ambulatory Visit: Payer: Self-pay | Admitting: Gastroenterology

## 2016-08-23 ENCOUNTER — Other Ambulatory Visit (HOSPITAL_COMMUNITY)
Admission: RE | Admit: 2016-08-23 | Discharge: 2016-08-23 | Disposition: A | Discharge status: Home / Self Care | Payer: BLUE CROSS/BLUE SHIELD | Source: Ambulatory Visit | Attending: Family Medicine | Admitting: Family Medicine

## 2016-08-23 ENCOUNTER — Other Ambulatory Visit: Payer: Self-pay | Admitting: Family Medicine

## 2016-08-23 DIAGNOSIS — Z124 Encounter for screening for malignant neoplasm of cervix: Secondary | ICD-10-CM | POA: Insufficient documentation

## 2016-08-23 DIAGNOSIS — Z1151 Encounter for screening for human papillomavirus (HPV): Secondary | ICD-10-CM | POA: Insufficient documentation

## 2016-08-24 LAB — CYTOLOGY - PAP

## 2018-02-15 ENCOUNTER — Ambulatory Visit: Payer: BLUE CROSS/BLUE SHIELD | Admitting: Emergency Medicine

## 2018-02-15 ENCOUNTER — Encounter: Payer: Self-pay | Admitting: Emergency Medicine

## 2018-02-15 ENCOUNTER — Other Ambulatory Visit: Payer: Self-pay

## 2018-02-15 VITALS — BP 107/73 | HR 76 | Temp 98.1°F | Resp 16 | Ht 61.5 in | Wt 117.2 lb

## 2018-02-15 DIAGNOSIS — G8929 Other chronic pain: Secondary | ICD-10-CM | POA: Insufficient documentation

## 2018-02-15 DIAGNOSIS — R1084 Generalized abdominal pain: Secondary | ICD-10-CM

## 2018-02-15 LAB — POCT URINALYSIS DIP (MANUAL ENTRY)
BILIRUBIN UA: NEGATIVE
BILIRUBIN UA: NEGATIVE mg/dL
Blood, UA: NEGATIVE
Glucose, UA: NEGATIVE mg/dL
Leukocytes, UA: NEGATIVE
Nitrite, UA: NEGATIVE
SPEC GRAV UA: 1.015 (ref 1.010–1.025)
Urobilinogen, UA: 0.2 E.U./dL
pH, UA: 7.5 (ref 5.0–8.0)

## 2018-02-15 LAB — POCT CBC
GRANULOCYTE PERCENT: 53.9 % (ref 37–80)
HEMATOCRIT: 39.2 % (ref 37.7–47.9)
Hemoglobin: 12.9 g/dL (ref 12.2–16.2)
Lymph, poc: 2.5 (ref 0.6–3.4)
MCH, POC: 26.7 pg — AB (ref 27–31.2)
MCHC: 32.8 g/dL (ref 31.8–35.4)
MCV: 81.4 fL (ref 80–97)
MID (cbc): 0.8 (ref 0–0.9)
MPV: 6.6 fL (ref 0–99.8)
POC GRANULOCYTE: 3.8 (ref 2–6.9)
POC LYMPH PERCENT: 35.5 %L (ref 10–50)
POC MID %: 10.6 % (ref 0–12)
Platelet Count, POC: 284 10*3/uL (ref 142–424)
RBC: 4.82 M/uL (ref 4.04–5.48)
RDW, POC: 12.7 %
WBC: 7.1 10*3/uL (ref 4.6–10.2)

## 2018-02-15 MED ORDER — DICYCLOMINE HCL 20 MG PO TABS: 20.0000 mg | ORAL_TABLET | Freq: Three times a day (TID) | ORAL | 1 refills | Status: DC | PRN

## 2018-02-15 NOTE — Progress Notes (Signed)
Phyllis Morton 42 y.o.   Chief Complaint  Patient presents with  . Abdominal Pain    x 1 week per patient stomach with bloating    HISTORY OF PRESENT ILLNESS: This is a 42 y.o. female complaining of generalized abdominal discomfort with bloating for 1 week.  Most of the pain is on the right upper quadrant.  Still eating and drinking okay.  Denies nausea or vomiting.  Denies fever or chills.  Denies rectal bleeding or melena.  Pain is intermittent and crampy.  No significant past medical history.  HPI   Prior to Admission medications   Medication Sig Start Date End Date Taking? Authorizing Provider  clarithromycin (BIAXIN) 500 MG tablet Take 1 tablet (500 mg total) by mouth 2 (two) times daily. Patient not taking: Reported on 02/15/2018 10/24/15   Le, Thao P, DO  ipratropium (ATROVENT) 0.03 % nasal spray Place 2 sprays into the nose 2 (two) times daily. 08/20/12 08/20/13  Porfirio Oar, PA-C  lansoprazole (PREVACID) 30 MG capsule Take 1 capsule (30 mg total) by mouth 2 (two) times daily before a meal. Patient not taking: Reported on 02/15/2018 10/24/15   Le, Thao P, DO  pantoprazole (PROTONIX) 40 MG tablet TAKE 1 TABLET (40 MG)  BY MOUTH DAILY Patient not taking: Reported on 02/15/2018 11/09/15   Trena Platt D, PA  sucralfate (CARAFATE) 1 g tablet Take 1 tablet (1 g total) by mouth 4 (four) times daily -  with meals and at bedtime. Patient not taking: Reported on 02/15/2018 12/11/15   Molpus, Jonny Ruiz, MD  traMADol (ULTRAM) 50 MG tablet Take 1 tablet (50 mg total) by mouth every 12 (twelve) hours as needed for severe pain. May cause constipation Patient not taking: Reported on 02/15/2018 10/20/15   Le, Thao P, DO    No Known Allergies  There are no active problems to display for this patient.   Past Medical History:  Diagnosis Date  . Helicobacter pylori gastritis   . Miscarriage 06/29/2012   LMP 04/10/2012    Past Surgical History:  Procedure Laterality Date  . CESAREAN SECTION       Social History   Socioeconomic History  . Marital status: Married    Spouse name: [name]  . Number of children: 1  . Years of education: 75  . Highest education level: Not on file  Social Needs  . Financial resource strain: Not on file  . Food insecurity - worry: Not on file  . Food insecurity - inability: Not on file  . Transportation needs - medical: Not on file  . Transportation needs - non-medical: Not on file  Occupational History  . Occupation: NAIL TECHNICIAN  Tobacco Use  . Smoking status: Never Smoker  . Smokeless tobacco: Never Used  Substance and Sexual Activity  . Alcohol use: No  . Drug use: No  . Sexual activity: Yes    Partners: Male    Birth control/protection: None  Other Topics Concern  . Not on file  Social History Narrative   From [place].  In the [place] since 2008.    Family History  Problem Relation Age of Onset  . Hypertension Mother      Review of Systems  Constitutional: Negative.  Negative for chills, fever and weight loss.  HENT: Negative.  Negative for congestion, nosebleeds and sore throat.   Eyes: Negative.   Respiratory: Negative.  Negative for cough and shortness of breath.   Cardiovascular: Negative.  Negative for chest pain, palpitations  and leg swelling.  Gastrointestinal: Positive for abdominal pain. Negative for blood in stool, constipation, diarrhea, melena, nausea and vomiting.  Genitourinary: Negative.  Negative for dysuria and hematuria.  Musculoskeletal: Negative.  Negative for back pain, myalgias and neck pain.  Skin: Negative.   Neurological: Negative.  Negative for dizziness and headaches.  Endo/Heme/Allergies: Negative.   All other systems reviewed and are negative.   Vitals:   02/15/18 1215  BP: 107/73  Pulse: 76  Resp: 16  Temp: 98.1 F (36.7 C)  SpO2: 99%    Physical Exam  Constitutional: She is oriented to person, place, and time. She appears well-developed and well-nourished.  HENT:  Head:  Normocephalic and atraumatic.  Right Ear: External ear normal.  Left Ear: External ear normal.  Nose: Nose normal.  Mouth/Throat: Oropharynx is clear and moist.  Eyes: EOM are normal. Pupils are equal, round, and reactive to light.  Neck: Normal range of motion. Neck supple. No JVD present. No thyromegaly present.  Cardiovascular: Normal rate, regular rhythm, normal heart sounds and intact distal pulses.  Pulmonary/Chest: Effort normal and breath sounds normal.  Abdominal: Soft. Bowel sounds are normal. She exhibits no distension and no mass. There is no tenderness. There is no rebound and no guarding.  Musculoskeletal: Normal range of motion.  Lymphadenopathy:    She has no cervical adenopathy.  Neurological: She is alert and oriented to person, place, and time. No sensory deficit. She exhibits normal muscle tone.  Skin: Skin is warm and dry. Capillary refill takes less than 2 seconds. No rash noted.  Psychiatric: She has a normal mood and affect. Her behavior is normal.  Vitals reviewed.   Results for orders placed or performed in visit on 02/15/18 (from the past 24 hour(s))  POCT CBC     Status: Abnormal   Collection Time: 02/15/18  1:00 PM  Result Value Ref Range   WBC 7.1 4.6 - 10.2 K/uL   Lymph, poc 2.5 0.6 - 3.4   POC LYMPH PERCENT 35.5 10 - 50 %L   MID (cbc) 0.8 0 - 0.9   POC MID % 10.6 0 - 12 %M   POC Granulocyte 3.8 2 - 6.9   Granulocyte percent 53.9 37 - 80 %G   RBC 4.82 4.04 - 5.48 M/uL   Hemoglobin 12.9 12.2 - 16.2 g/dL   HCT, POC 16.139.2 09.637.7 - 47.9 %   MCV 81.4 80 - 97 fL   MCH, POC 26.7 (A) 27 - 31.2 pg   MCHC 32.8 31.8 - 35.4 g/dL   RDW, POC 04.512.7 %   Platelet Count, POC 284 142 - 424 K/uL   MPV 6.6 0 - 99.8 fL  POCT urinalysis dipstick     Status: Abnormal   Collection Time: 02/15/18  1:01 PM  Result Value Ref Range   Color, UA yellow yellow   Clarity, UA clear clear   Glucose, UA negative negative mg/dL   Bilirubin, UA negative negative   Ketones, POC UA  negative negative mg/dL   Spec Grav, UA 4.0981.015 1.1911.010 - 1.025   Blood, UA negative negative   pH, UA 7.5 5.0 - 8.0   Protein Ur, POC trace (A) negative mg/dL   Urobilinogen, UA 0.2 0.2 or 1.0 E.U./dL   Nitrite, UA Negative Negative   Leukocytes, UA Negative Negative   A total of 25 minutes was spent in the room with the patient, greater than 50% of which was in counseling/coordination of care.  ASSESSMENT & PLAN: Phyllis Morton was  seen today for abdominal pain.  Diagnoses and all orders for this visit:  Generalized abdominal pain -     POCT CBC -     POCT urinalysis dipstick -     Comprehensive metabolic panel -     Lipase -     US Abdomen Complete; Future -     dicyclomine (BENTYL) 20 MG tablet; Take 1 tablet (20 mg total) by mouth 3 (three) times daily as needed for spasms.    Patient Instructions       IF you received an x-ray today, you will receive an invoice from [facility]. Please contact [facility] at [phone] with questions or concerns regarding your invoice.   IF you received labwork today, you will receive an invoice from [place]. Please contact [facility] at [phone] with questions or concerns regarding your invoice.   Our billing staff will not be able to assist you with questions regarding bills from these companies.  You will be contacted with the lab results as soon as they are available. The fastest way to get your results is to activate your My Chart account. Instructions are located on the last page of this paperwork. If you have not heard from [place] regarding the results in 2 weeks, please contact this office.     Abdominal Pain, Adult Many things can cause belly (abdominal) pain. Most times, belly pain is not dangerous. Many cases of belly pain can be watched and treated at home. Sometimes belly pain is serious, though. Your doctor will try to find the cause of your belly pain. Follow these instructions at home:  Take over-the-counter and  prescription medicines only as told by your doctor. Do not take medicines that help you poop (laxatives) unless told to by your doctor.  Drink enough fluid to keep your pee (urine) clear or pale yellow.  Watch your belly pain for any changes.  Keep all follow-up visits as told by your doctor. This is important. Contact a doctor if:  Your belly pain changes or gets worse.  You are not hungry, or you lose weight without trying.  You are having trouble pooping (constipated) or have watery poop (diarrhea) for more than 2-3 days.  You have pain when you pee or poop.  Your belly pain wakes you up at night.  Your pain gets worse with meals, after eating, or with certain foods.  You are throwing up and cannot keep anything down.  You have a fever. Get help right away if:  Your pain does not go away as soon as your doctor says it should.  You cannot stop throwing up.  Your pain is only in areas of your belly, such as the right side or the left lower part of the belly.  You have bloody or black poop, or poop that looks like tar.  You have very bad pain, cramping, or bloating in your belly.  You have signs of not having enough fluid or water in your body (dehydration), such as: ? Dark pee, very little pee, or no pee. ? Cracked lips. ? Dry mouth. ? Sunken eyes. ? Sleepiness. ? Weakness. This information is not intended to replace advice given to you by your health care provider. Make sure you discuss any questions you have with your health care provider. Document Released: 05/16/2008 Document Revised: 06/17/2016 Document Reviewed: 05/11/2016 Elsevier Interactive Patient Education  2018 Elsevier Inc.      Edwina Barth, MD [facility]

## 2018-02-15 NOTE — Patient Instructions (Addendum)
     IF you received an x-ray today, you will receive an invoice from [facility]. Please contact [facility] at [phone] with questions or concerns regarding your invoice.   IF you received labwork today, you will receive an invoice from [facility]. Please contact [facility] at [phone] with questions or concerns regarding your invoice.   Our billing staff will not be able to assist you with questions regarding bills from these companies.  You will be contacted with the lab results as soon as they are available. The fastest way to get your results is to activate your My Chart account. Instructions are located on the last page of this paperwork. If you have not heard from us regarding the results in 2 weeks, please contact this office.      Abdominal Pain, Adult Many things can cause belly (abdominal) pain. Most times, belly pain is not dangerous. Many cases of belly pain can be watched and treated at home. Sometimes belly pain is serious, though. Your doctor will try to find the cause of your belly pain. Follow these instructions at home:  Take over-the-counter and prescription medicines only as told by your doctor. Do not take medicines that help you poop (laxatives) unless told to by your doctor.  Drink enough fluid to keep your pee (urine) clear or pale yellow.  Watch your belly pain for any changes.  Keep all follow-up visits as told by your doctor. This is important. Contact a doctor if:  Your belly pain changes or gets worse.  You are not hungry, or you lose weight without trying.  You are having trouble pooping (constipated) or have watery poop (diarrhea) for more than 2-3 days.  You have pain when you pee or poop.  Your belly pain wakes you up at night.  Your pain gets worse with meals, after eating, or with certain foods.  You are throwing up and cannot keep anything down.  You have a fever. Get help right away if:  Your pain does not go  away as soon as your doctor says it should.  You cannot stop throwing up.  Your pain is only in areas of your belly, such as the right side or the left lower part of the belly.  You have bloody or black poop, or poop that looks like tar.  You have very bad pain, cramping, or bloating in your belly.  You have signs of not having enough fluid or water in your body (dehydration), such as: ? Dark pee, very little pee, or no pee. ? Cracked lips. ? Dry mouth. ? Sunken eyes. ? Sleepiness. ? Weakness. This information is not intended to replace advice given to you by your health care provider. Make sure you discuss any questions you have with your health care provider. Document Released: 05/16/2008 Document Revised: 06/17/2016 Document Reviewed: 05/11/2016 Elsevier Interactive Patient Education  2018 Elsevier Inc.  

## 2018-02-16 LAB — COMPREHENSIVE METABOLIC PANEL
ALT: 8 IU/L (ref 0–32)
AST: 17 IU/L (ref 0–40)
Albumin/Globulin Ratio: 1.3 (ref 1.2–2.2)
Albumin: 4.4 g/dL (ref 3.5–5.5)
Alkaline Phosphatase: 57 IU/L (ref 39–117)
BILIRUBIN TOTAL: 0.2 mg/dL (ref 0.0–1.2)
BUN/Creatinine Ratio: 19 (ref 9–23)
BUN: 14 mg/dL (ref 6–24)
CALCIUM: 9.5 mg/dL (ref 8.7–10.2)
CHLORIDE: 103 mmol/L (ref 96–106)
CO2: 24 mmol/L (ref 20–29)
Creatinine, Ser: 0.73 mg/dL (ref 0.57–1.00)
GFR, EST AFRICAN AMERICAN: 117 mL/min/{1.73_m2} (ref >59)
GFR, EST NON AFRICAN AMERICAN: 101 mL/min/{1.73_m2} (ref >59)
GLUCOSE: 84 mg/dL (ref 65–99)
Globulin, Total: 3.4 g/dL (ref 1.5–4.5)
Potassium: 4.1 mmol/L (ref 3.5–5.2)
Sodium: 142 mmol/L (ref 134–144)
TOTAL PROTEIN: 7.8 g/dL (ref 6.0–8.5)

## 2018-02-16 LAB — LIPASE: Lipase: 42 U/L (ref 14–72)

## 2020-01-08 ENCOUNTER — Encounter: Payer: Self-pay | Admitting: Registered Nurse

## 2020-01-08 ENCOUNTER — Other Ambulatory Visit: Payer: Self-pay

## 2020-01-08 ENCOUNTER — Ambulatory Visit: Payer: 59 | Admitting: Registered Nurse

## 2020-01-08 VITALS — BP 106/76 | HR 95 | Temp 97.4°F | Ht 61.0 in | Wt 117.0 lb

## 2020-01-08 DIAGNOSIS — R1084 Generalized abdominal pain: Secondary | ICD-10-CM | POA: Diagnosis not present

## 2020-01-08 DIAGNOSIS — G8929 Other chronic pain: Secondary | ICD-10-CM

## 2020-01-08 DIAGNOSIS — R1013 Epigastric pain: Secondary | ICD-10-CM

## 2020-01-08 DIAGNOSIS — Z23 Encounter for immunization: Secondary | ICD-10-CM | POA: Diagnosis not present

## 2020-01-08 MED ORDER — SUCRALFATE 1 G PO TABS: 1.0000 g | ORAL_TABLET | Freq: Three times a day (TID) | ORAL | 0 refills | Status: DC

## 2020-01-08 MED ORDER — OMEPRAZOLE 40 MG PO CPDR: 40.0000 mg | DELAYED_RELEASE_CAPSULE | Freq: Every day | ORAL | 3 refills | Status: DC

## 2020-01-08 NOTE — Progress Notes (Signed)
Acute Office Visit  Subjective:    Patient ID: Phyllis Morton, female    DOB: Apr 21, 1976, 44 y.o.   MRN: [id number]  Chief Complaint  Patient presents with  . Abdominal Pain    abdomial and back pain for a week. patient states she takes aleve for pain but not working.    HPI Patient is in today for abdominal pain Onset one week ago, worsening Mostly epigastric, but generalized abd pain as well Constant, worse at night, worse regardless of eating. Burning, sharp pain Has tried ibuprofen without relief  Happened before a few years ago, was GERD  Past Medical History:  Diagnosis Date  . Helicobacter pylori gastritis   . Miscarriage 06/29/2012   LMP 04/10/2012    Past Surgical History:  Procedure Laterality Date  . CESAREAN SECTION      Family History  Problem Relation Age of Onset  . Hypertension Mother     Social History   Socioeconomic History  . Marital status: Married    Spouse name: Lequita Halt  . Number of children: 1  . Years of education: 23  . Highest education level: Not on file  Occupational History  . Occupation: NAIL TECHNICIAN  Tobacco Use  . Smoking status: Never Smoker  . Smokeless tobacco: Never Used  Substance and Sexual Activity  . Alcohol use: No  . Drug use: No  . Sexual activity: Yes    Partners: Male    Birth control/protection: None  Other Topics Concern  . Not on file  Social History Narrative   From [place].  In the [place] since 2008.   Social Determinants of Health   Financial Resource Strain:   . Difficulty of Paying Living Expenses: Not on file  Food Insecurity:   . Worried About Charity fundraiser in the Last Year: Not on file  . Ran Out of Food in the Last Year: Not on file  Transportation Needs:   . Lack of Transportation (Medical): Not on file  . Lack of Transportation (Non-Medical): Not on file  Physical Activity:   . Days of Exercise per Week: Not on file  . Minutes of Exercise per Session: Not on file  Stress:     . Feeling of Stress : Not on file  Social Connections:   . Frequency of Communication with Friends and Family: Not on file  . Frequency of Social Gatherings with Friends and Family: Not on file  . Attends Religious Services: Not on file  . Active Member of Clubs or Organizations: Not on file  . Attends Archivist Meetings: Not on file  . Marital Status: Not on file  Intimate Partner Violence:   . Fear of Current or Ex-Partner: Not on file  . Emotionally Abused: Not on file  . Physically Abused: Not on file  . Sexually Abused: Not on file    Outpatient Medications Prior to Visit  Medication Sig Dispense Refill  . clarithromycin (BIAXIN) 500 MG tablet Take 1 tablet (500 mg total) by mouth 2 (two) times daily. (Patient not taking: Reported on 02/15/2018) 20 tablet 0  . dicyclomine (BENTYL) 20 MG tablet Take 1 tablet (20 mg total) by mouth 3 (three) times daily as needed for spasms. (Patient not taking: Reported on 01/08/2020) 30 tablet 1  . ipratropium (ATROVENT) 0.03 % nasal spray Place 2 sprays into the nose 2 (two) times daily. 30 mL 5  . lansoprazole (PREVACID) 30 MG capsule Take 1 capsule (30 mg total)  by mouth 2 (two) times daily before a meal. (Patient not taking: Reported on 02/15/2018) 20 capsule 0  . pantoprazole (PROTONIX) 40 MG tablet TAKE 1 TABLET (40 MG)  BY MOUTH DAILY (Patient not taking: Reported on 02/15/2018) 30 tablet 0  . sucralfate (CARAFATE) 1 g tablet Take 1 tablet (1 g total) by mouth 4 (four) times daily -  with meals and at bedtime. (Patient not taking: Reported on 02/15/2018) 40 tablet 0  . traMADol (ULTRAM) 50 MG tablet Take 1 tablet (50 mg total) by mouth every 12 (twelve) hours as needed for severe pain. May cause constipation (Patient not taking: Reported on 02/15/2018) 20 tablet 0   Facility-Administered Medications Prior to Visit  Medication Dose Route Frequency Provider Last Rate Last Admin  . gi cocktail (Maalox,Lidocaine,Donnatal)  30 mL Oral Once Le,  Thao P, DO        No Known Allergies  Review of Systems  Constitutional: Negative.   HENT: Negative.   Eyes: Negative.   Respiratory: Negative.   Cardiovascular: Negative.   Gastrointestinal: Positive for abdominal pain. Negative for abdominal distention, anal bleeding, blood in stool, constipation, diarrhea, nausea, rectal pain and vomiting.  Endocrine: Negative.   Genitourinary: Negative.   Musculoskeletal: Negative.   Skin: Negative.   Allergic/Immunologic: Negative.   Neurological: Negative.   Hematological: Negative.   Psychiatric/Behavioral: Negative.   All other systems reviewed and are negative.      Objective:    Physical Exam Vitals and nursing note reviewed.  Constitutional:      General: She is not in acute distress.    Appearance: She is well-developed and normal weight. She is not ill-appearing, toxic-appearing or diaphoretic.  Abdominal:     General: Abdomen is flat. Bowel sounds are normal. There is no distension or abdominal bruit. There are no signs of injury.     Palpations: Abdomen is soft.     Tenderness: There is generalized abdominal tenderness and tenderness in the epigastric area. There is no right CVA tenderness, left CVA tenderness or guarding.     Hernia: No hernia is present.  Skin:    General: Skin is warm and dry.     Capillary Refill: Capillary refill takes less than 2 seconds.  Neurological:     General: No focal deficit present.     Mental Status: She is alert.  Psychiatric:        Mood and Affect: Mood normal. Mood is not anxious or depressed.        Behavior: Behavior normal.     BP 106/76   Pulse 95   Temp (!) 97.4 F (36.3 C) (Temporal)   Ht 5' 1"  (1.549 m)   Wt 117 lb (53.1 kg)   LMP 01/05/2020   SpO2 100%   BMI 22.11 kg/m  Wt Readings from Last 3 Encounters:  01/08/20 117 lb (53.1 kg)  02/15/18 117 lb 3.2 oz (53.2 kg)  12/15/15 115 lb (52.2 kg)    Health Maintenance Due  Topic Date Due  . HIV Screening   03/01/1989  . INFLUENZA VACCINE  07/13/2019  . PAP SMEAR-Modifier  08/24/2019    There are no preventive care reminders to display for this patient.   No results found for: TSH Lab Results  Component Value Date   WBC 7.1 02/15/2018   HGB 12.9 02/15/2018   HCT 39.2 02/15/2018   MCV 81.4 02/15/2018   PLT 260 12/15/2015   Lab Results  Component Value Date   NA 142  02/15/2018   K 4.1 02/15/2018   CO2 24 02/15/2018   GLUCOSE 84 02/15/2018   BUN 14 02/15/2018   CREATININE 0.73 02/15/2018   BILITOT 0.2 02/15/2018   ALKPHOS 57 02/15/2018   AST 17 02/15/2018   ALT 8 02/15/2018   PROT 7.8 02/15/2018   ALBUMIN 4.4 02/15/2018   CALCIUM 9.5 02/15/2018   ANIONGAP 7 12/15/2015   No results found for: CHOL No results found for: HDL No results found for: LDLCALC No results found for: TRIG No results found for: CHOLHDL No results found for: HGBA1C     Assessment & Plan:   Problem List Items Addressed This Visit      Other   Generalized abdominal pain - Primary   Relevant Orders   CBC   CMP14+EGFR   Lipase   Amylase    Other Visit Diagnoses    Abdominal pain, chronic, epigastric       Epigastric pain       Relevant Medications   omeprazole (PRILOSEC) 40 MG capsule   sucralfate (CARAFATE) 1 g tablet   Flu vaccine need       Relevant Orders   Flu Vaccine QUAD 6+ mos PF IM (Fluarix Quad PF)       Meds ordered this encounter  Medications  . omeprazole (PRILOSEC) 40 MG capsule    Sig: Take 1 capsule (40 mg total) by mouth daily.    Dispense:  30 capsule    Refill:  3    Order Specific Question:   Supervising Provider    Answer:   Delia Chimes A [id number]  . sucralfate (CARAFATE) 1 g tablet    Sig: Take 1 tablet (1 g total) by mouth 4 (four) times daily -  with meals and at bedtime.    Dispense:  30 tablet    Refill:  0    Order Specific Question:   Supervising Provider    Answer:   Forrest Moron [id number]   PLAN  Pt notes towards end of visit that she  has vomited scant amounts of blood in the mornings on occasion. This resolves quickly. Denies coffee ground emesis, denies melena  Feel that this is GERD - likely exacerbated by ibuprofen use. Will try sucralfate and omeprazole 32m PO qd.  Return in 2 weeks if symptoms worsen or fail to improve  Labs drawn, will follow up as warranted.  Patient encouraged to call clinic with any questions, comments, or concerns.   RMaximiano Coss NP

## 2020-01-08 NOTE — Patient Instructions (Signed)
° ° ° °  If you have lab work done today you will be contacted with your lab results within the next 2 weeks.  If you have not heard from us then please contact us. The fastest way to get your results is to register for My Chart. ° ° °IF you received an x-ray today, you will receive an invoice from [facility]. Please contact [facility] at [phone] with questions or concerns regarding your invoice.  ° °IF you received labwork today, you will receive an invoice from [facility]. Please contact [facility] at [phone] with questions or concerns regarding your invoice.  ° °Our billing staff will not be able to assist you with questions regarding bills from these companies. ° °You will be contacted with the lab results as soon as they are available. The fastest way to get your results is to activate your My Chart account. Instructions are located on the last page of this paperwork. If you have not heard from us regarding the results in 2 weeks, please contact this office. °  ° ° ° °

## 2020-01-09 LAB — CBC
Hematocrit: 35.8 % (ref 34.0–46.6)
Hemoglobin: 10.7 g/dL — ABNORMAL LOW (ref 11.1–15.9)
MCH: 21.4 pg — ABNORMAL LOW (ref 26.6–33.0)
MCHC: 29.9 g/dL — ABNORMAL LOW (ref 31.5–35.7)
MCV: 72 fL — ABNORMAL LOW (ref 79–97)
Platelets: 402 10*3/uL (ref 150–450)
RBC: 4.99 x10E6/uL (ref 3.77–5.28)
RDW: 15.2 % (ref 11.7–15.4)
WBC: 9.9 10*3/uL (ref 3.4–10.8)

## 2020-01-09 LAB — CMP14+EGFR
ALT: 12 IU/L (ref 0–32)
AST: 18 IU/L (ref 0–40)
Albumin/Globulin Ratio: 1.6 (ref 1.2–2.2)
Albumin: 4.7 g/dL (ref 3.8–4.8)
Alkaline Phosphatase: 66 IU/L (ref 39–117)
BUN/Creatinine Ratio: 17 (ref 9–23)
BUN: 10 mg/dL (ref 6–24)
Bilirubin Total: <0.2 mg/dL (ref 0.0–1.2)
CO2: 20 mmol/L (ref 20–29)
Calcium: 9.6 mg/dL (ref 8.7–10.2)
Chloride: 106 mmol/L (ref 96–106)
Creatinine, Ser: 0.59 mg/dL (ref 0.57–1.00)
GFR calc Af Amer: 128 mL/min/{1.73_m2} (ref >59)
GFR calc non Af Amer: 111 mL/min/{1.73_m2} (ref >59)
Globulin, Total: 3 g/dL (ref 1.5–4.5)
Glucose: 105 mg/dL — ABNORMAL HIGH (ref 65–99)
Potassium: 3.9 mmol/L (ref 3.5–5.2)
Sodium: 141 mmol/L (ref 134–144)
Total Protein: 7.7 g/dL (ref 6.0–8.5)

## 2020-01-09 LAB — LIPASE: Lipase: 33 U/L (ref 14–72)

## 2020-01-09 LAB — AMYLASE: Amylase: 41 U/L (ref 31–110)

## 2020-01-10 NOTE — Progress Notes (Signed)
Good afternoon,  If we could call Phyllis Morton to let her know her labs are for the most part normal, but her iron is low, that would be great. She should start on an OTC iron supplement or multivitamin with iron. I'd like to recheck these numbers in about 3-6 months  Thank you  Jari Sportsman, NP

## 2020-07-21 ENCOUNTER — Ambulatory Visit: Payer: 59 | Admitting: Registered Nurse

## 2020-07-21 ENCOUNTER — Other Ambulatory Visit: Payer: Self-pay

## 2020-07-21 ENCOUNTER — Encounter: Payer: Self-pay | Admitting: Registered Nurse

## 2020-07-21 VITALS — BP 105/71 | HR 81 | Temp 98.0°F | Resp 18 | Ht 61.0 in | Wt 117.0 lb

## 2020-07-21 DIAGNOSIS — R1084 Generalized abdominal pain: Secondary | ICD-10-CM | POA: Diagnosis not present

## 2020-07-21 DIAGNOSIS — R1013 Epigastric pain: Secondary | ICD-10-CM | POA: Diagnosis not present

## 2020-07-21 DIAGNOSIS — R110 Nausea: Secondary | ICD-10-CM | POA: Diagnosis not present

## 2020-07-21 LAB — POCT URINALYSIS DIP (CLINITEK)
Bilirubin, UA: NEGATIVE
Blood, UA: NEGATIVE
Glucose, UA: NEGATIVE mg/dL
Ketones, POC UA: NEGATIVE mg/dL
Leukocytes, UA: NEGATIVE
Nitrite, UA: NEGATIVE
POC PROTEIN,UA: NEGATIVE
Spec Grav, UA: 1.02 (ref 1.010–1.025)
Urobilinogen, UA: 0.2 E.U./dL
pH, UA: 8.5 — AB (ref 5.0–8.0)

## 2020-07-21 MED ORDER — LANSOPRAZOLE 30 MG PO CPDR: 30.0000 mg | DELAYED_RELEASE_CAPSULE | Freq: Two times a day (BID) | ORAL | 0 refills | Status: DC

## 2020-07-21 MED ORDER — TRAMADOL HCL 50 MG PO TABS: 50.0000 mg | ORAL_TABLET | Freq: Two times a day (BID) | ORAL | 0 refills | Status: DC | PRN

## 2020-07-21 MED ORDER — SUCRALFATE 1 G PO TABS: 1.0000 g | ORAL_TABLET | Freq: Three times a day (TID) | ORAL | 0 refills | Status: DC

## 2020-07-21 MED ORDER — ONDANSETRON HCL 4 MG PO TABS: 4.0000 mg | ORAL_TABLET | Freq: Three times a day (TID) | ORAL | 0 refills | Status: DC | PRN

## 2020-07-21 NOTE — Progress Notes (Signed)
Established Patient Office Visit  Subjective:  Patient ID: Phyllis Morton, female    DOB: 03-Sep-1976  Age: 44 y.o. MRN: [id number]  CC:  Chief Complaint  Patient presents with   Abdominal Pain    patient states she has been having some stomach and back pain for about 2-3 weeks making her feel like she is going to vomit. Per patient she has been taking Nexium for some relief but its not working much.    HPI Phyllis Morton presents for abdominal pain  Has happened before, but this feels worse.  Epigastric is worst, but having generalized abdominal pain as well Some nausea, but no vomiting  No diarrhea or constipation No changes to diet or lifestyle  Has been taking esomeprazole OTC. Has had mild relief but nothing major Has taken lansoprazole, omeprazole, sucralfate in the past. Has had hx of H pylori infection - unclear if she had had follow up testing to confirm eradication.  No lightheadedness, dizziness, blood in stool, melena, hematochezia   Past Medical History:  Diagnosis Date   Helicobacter pylori gastritis    Miscarriage 06/29/2012   LMP 04/10/2012    Past Surgical History:  Procedure Laterality Date   CESAREAN SECTION      Family History  Problem Relation Age of Onset   Hypertension Mother     Social History   Socioeconomic History   Marital status: Married    Spouse name: [name]   Number of children: 1   Years of education: 9   Highest education level: Not on file  Occupational History   Occupation: NAIL TECHNICIAN  Tobacco Use   Smoking status: Never Smoker   Smokeless tobacco: Never Used  Substance and Sexual Activity   Alcohol use: No   Drug use: No   Sexual activity: Yes    Partners: Male    Birth control/protection: None  Other Topics Concern   Not on file  Social History Narrative   From [place].  In the [place] since 2008.   Social Determinants of Health   Financial Resource Strain:    Difficulty of Paying Living  Expenses:   Food Insecurity:    Worried About Programme researcher, broadcasting/film/video in the Last Year:    Barista in the Last Year:   Transportation Needs:    Freight forwarder (Medical):    Lack of Transportation (Non-Medical):   Physical Activity:    Days of Exercise per Week:    Minutes of Exercise per Session:   Stress:    Feeling of Stress :   Social Connections:    Frequency of Communication with Friends and Family:    Frequency of Social Gatherings with Friends and Family:    Attends Religious Services:    Active Member of Clubs or Organizations:    Attends Engineer, structural:    Marital Status:   Intimate Partner Violence:    Fear of Current or Ex-Partner:    Emotionally Abused:    Physically Abused:    Sexually Abused:     Outpatient Medications Prior to Visit  Medication Sig Dispense Refill   omeprazole (PRILOSEC) 40 MG capsule Take 1 capsule (40 mg total) by mouth daily. 30 capsule 3   clarithromycin (BIAXIN) 500 MG tablet Take 1 tablet (500 mg total) by mouth 2 (two) times daily. (Patient not taking: Reported on 02/15/2018) 20 tablet 0   dicyclomine (BENTYL) 20 MG tablet Take 1 tablet (20 mg total) by  mouth 3 (three) times daily as needed for spasms. (Patient not taking: Reported on 01/08/2020) 30 tablet 1   ipratropium (ATROVENT) 0.03 % nasal spray Place 2 sprays into the nose 2 (two) times daily. 30 mL 5   lansoprazole (PREVACID) 30 MG capsule Take 1 capsule (30 mg total) by mouth 2 (two) times daily before a meal. (Patient not taking: Reported on 02/15/2018) 20 capsule 0   pantoprazole (PROTONIX) 40 MG tablet TAKE 1 TABLET (40 MG)  BY MOUTH DAILY (Patient not taking: Reported on 02/15/2018) 30 tablet 0   sucralfate (CARAFATE) 1 g tablet Take 1 tablet (1 g total) by mouth 4 (four) times daily -  with meals and at bedtime. (Patient not taking: Reported on 02/15/2018) 40 tablet 0   sucralfate (CARAFATE) 1 g tablet Take 1 tablet (1 g total) by mouth  4 (four) times daily -  with meals and at bedtime. (Patient not taking: Reported on 07/21/2020) 30 tablet 0   traMADol (ULTRAM) 50 MG tablet Take 1 tablet (50 mg total) by mouth every 12 (twelve) hours as needed for severe pain. May cause constipation (Patient not taking: Reported on 02/15/2018) 20 tablet 0   Facility-Administered Medications Prior to Visit  Medication Dose Route Frequency Provider Last Rate Last Admin   gi cocktail (Maalox,Lidocaine,Donnatal)  30 mL Oral Once Le, Thao P, DO        No Known Allergies  ROS Review of Systems Pertinent negatives and positives in HPI   Objective:    Physical Exam Vitals and nursing note reviewed.  Constitutional:      General: She is not in acute distress.    Appearance: She is well-developed and normal weight. She is not ill-appearing, toxic-appearing or diaphoretic.  HENT:     Head: Normocephalic and atraumatic.  Pulmonary:     Effort: Pulmonary effort is normal. No respiratory distress.  Abdominal:     General: Abdomen is flat. Bowel sounds are normal. There is no distension.     Tenderness: There is generalized abdominal tenderness and tenderness in the epigastric area.  Skin:    General: Skin is warm and dry.  Neurological:     General: No focal deficit present.     Mental Status: She is alert and oriented to person, place, and time.     BP 105/71    Pulse 81    Temp 98 F (36.7 C) (Temporal)    Resp 18    Ht 5\' 1"  (1.549 m)    Wt 117 lb (53.1 kg)    SpO2 100%    BMI 22.11 kg/m  Wt Readings from Last 3 Encounters:  07/21/20 117 lb (53.1 kg)  01/08/20 117 lb (53.1 kg)  02/15/18 117 lb 3.2 oz (53.2 kg)     Health Maintenance Due  Topic Date Due   INFLUENZA VACCINE  07/12/2020    There are no preventive care reminders to display for this patient.  No results found for: TSH Lab Results  Component Value Date   WBC 9.9 01/08/2020   HGB 10.7 (L) 01/08/2020   HCT 35.8 01/08/2020   MCV 72 (L) 01/08/2020   PLT 402  01/08/2020   Lab Results  Component Value Date   NA 141 01/08/2020   K 3.9 01/08/2020   CO2 20 01/08/2020   GLUCOSE 105 (H) 01/08/2020   BUN 10 01/08/2020   CREATININE 0.59 01/08/2020   BILITOT <0.2 01/08/2020   ALKPHOS 66 01/08/2020   AST 18 01/08/2020  ALT 12 01/08/2020   PROT 7.7 01/08/2020   ALBUMIN 4.7 01/08/2020   CALCIUM 9.6 01/08/2020   ANIONGAP 7 12/15/2015   No results found for: CHOL No results found for: HDL No results found for: LDLCALC No results found for: TRIG No results found for: CHOLHDL No results found for: FUXN2T    Assessment & Plan:   Problem List Items Addressed This Visit      Other   Generalized abdominal pain - Primary   Relevant Orders   POCT URINALYSIS DIP (CLINITEK) (Completed)   CBC   Comprehensive metabolic panel   Amylase   Lipase    Other Visit Diagnoses    Abdominal pain, epigastric       Relevant Medications   lansoprazole (PREVACID) 30 MG capsule   sucralfate (CARAFATE) 1 g tablet   traMADol (ULTRAM) 50 MG tablet   Nausea without vomiting       Relevant Medications   ondansetron (ZOFRAN) 4 MG tablet      Meds ordered this encounter  Medications   lansoprazole (PREVACID) 30 MG capsule    Sig: Take 1 capsule (30 mg total) by mouth 2 (two) times daily before a meal.    Dispense:  20 capsule    Refill:  0    Order Specific Question:   Supervising Provider    Answer:   Neva Seat, JEFFREY R [[id number]]   DISCONTD: sucralfate (CARAFATE) 1 g tablet    Sig: Take 1 tablet (1 g total) by mouth 4 (four) times daily -  with meals and at bedtime.    Dispense:  40 tablet    Refill:  0    Order Specific Question:   Supervising Provider    Answer:   Neva Seat, JEFFREY R [[id number]]   DISCONTD: traMADol (ULTRAM) 50 MG tablet    Sig: Take 1 tablet (50 mg total) by mouth every 12 (twelve) hours as needed for severe pain. May cause constipation    Dispense:  20 tablet    Refill:  0    Order Specific Question:   Supervising Provider    Answer:    Neva Seat, JEFFREY R [[id number]]   ondansetron (ZOFRAN) 4 MG tablet    Sig: Take 1 tablet (4 mg total) by mouth every 8 (eight) hours as needed for nausea or vomiting.    Dispense:  20 tablet    Refill:  0    Order Specific Question:   Supervising Provider    Answer:   Neva Seat, JEFFREY R [[id number]]   sucralfate (CARAFATE) 1 g tablet    Sig: Take 1 tablet (1 g total) by mouth 4 (four) times daily -  with meals and at bedtime.    Dispense:  40 tablet    Refill:  0    Order Specific Question:   Supervising Provider    Answer:   Neva Seat, JEFFREY R [[id number]]   traMADol (ULTRAM) 50 MG tablet    Sig: Take 1 tablet (50 mg total) by mouth every 12 (twelve) hours as needed for severe pain. May cause constipation    Dispense:  20 tablet    Refill:  0    Order Specific Question:   Supervising Provider    Answer:   Neva Seat, JEFFREY R [[id number]]    Follow-up: No follow-ups on file.   PLAN  Start lansoprazole, sucralfate, tramadol, and ondansetron  Labs collected, will follow up as warranted  Refer to GI if labs show concern  Patient encouraged to call clinic with any questions,  comments, or concerns.  Maximiano Coss, NP

## 2020-07-21 NOTE — Patient Instructions (Signed)
° ° ° °  If you have lab work done today you will be contacted with your lab results within the next 2 weeks.  If you have not heard from us then please contact us. The fastest way to get your results is to register for My Chart. ° ° °IF you received an x-ray today, you will receive an invoice from [facility]. Please contact [facility] at [phone] with questions or concerns regarding your invoice.  ° °IF you received labwork today, you will receive an invoice from [facility]. Please contact [facility] at [phone] with questions or concerns regarding your invoice.  ° °Our billing staff will not be able to assist you with questions regarding bills from these companies. ° °You will be contacted with the lab results as soon as they are available. The fastest way to get your results is to activate your My Chart account. Instructions are located on the last page of this paperwork. If you have not heard from us regarding the results in 2 weeks, please contact this office. °  ° ° ° °

## 2020-07-22 LAB — COMPREHENSIVE METABOLIC PANEL
ALT: 4 IU/L (ref 0–32)
AST: 9 IU/L (ref 0–40)
Albumin/Globulin Ratio: 1.3 (ref 1.2–2.2)
Albumin: 4.1 g/dL (ref 3.8–4.8)
Alkaline Phosphatase: 62 IU/L (ref 48–121)
BUN/Creatinine Ratio: 16 (ref 9–23)
BUN: 10 mg/dL (ref 6–24)
Bilirubin Total: 0.2 mg/dL (ref 0.0–1.2)
CO2: 21 mmol/L (ref 20–29)
Calcium: 8.9 mg/dL (ref 8.7–10.2)
Chloride: 105 mmol/L (ref 96–106)
Creatinine, Ser: 0.64 mg/dL (ref 0.57–1.00)
GFR calc Af Amer: 124 mL/min/{1.73_m2} (ref >59)
GFR calc non Af Amer: 107 mL/min/{1.73_m2} (ref >59)
Globulin, Total: 3.2 g/dL (ref 1.5–4.5)
Glucose: 90 mg/dL (ref 65–99)
Potassium: 4.5 mmol/L (ref 3.5–5.2)
Sodium: 139 mmol/L (ref 134–144)
Total Protein: 7.3 g/dL (ref 6.0–8.5)

## 2020-07-22 LAB — CBC
Hematocrit: 35.1 % (ref 34.0–46.6)
Hemoglobin: 10.4 g/dL — ABNORMAL LOW (ref 11.1–15.9)
MCH: 20.6 pg — ABNORMAL LOW (ref 26.6–33.0)
MCHC: 29.6 g/dL — ABNORMAL LOW (ref 31.5–35.7)
MCV: 70 fL — ABNORMAL LOW (ref 79–97)
Platelets: 389 10*3/uL (ref 150–450)
RBC: 5.04 x10E6/uL (ref 3.77–5.28)
RDW: 16.3 % — ABNORMAL HIGH (ref 11.7–15.4)
WBC: 6.8 10*3/uL (ref 3.4–10.8)

## 2020-07-22 LAB — AMYLASE: Amylase: 49 U/L (ref 31–110)

## 2020-07-22 LAB — LIPASE: Lipase: 44 U/L (ref 14–72)

## 2020-07-23 ENCOUNTER — Encounter: Payer: Self-pay | Admitting: Radiology

## 2020-07-23 NOTE — Progress Notes (Signed)
Hello,  Letter please,  Labs are fine - mild anemia, but nothing indicative of GI bleed. Let's monitor response to the medications we discussed and consider specialist work up from there.  Thank you  Jari Sportsman, NP

## 2020-07-28 ENCOUNTER — Other Ambulatory Visit: Payer: Self-pay | Admitting: Registered Nurse

## 2020-07-28 DIAGNOSIS — R1013 Epigastric pain: Secondary | ICD-10-CM

## 2020-08-06 ENCOUNTER — Other Ambulatory Visit: Payer: Self-pay | Admitting: Registered Nurse

## 2020-08-06 DIAGNOSIS — R1013 Epigastric pain: Secondary | ICD-10-CM

## 2020-08-13 ENCOUNTER — Other Ambulatory Visit: Payer: Self-pay | Admitting: Registered Nurse

## 2020-08-13 DIAGNOSIS — R1013 Epigastric pain: Secondary | ICD-10-CM

## 2020-09-14 ENCOUNTER — Encounter: Payer: 59 | Admitting: Registered Nurse

## 2020-10-05 ENCOUNTER — Encounter: Payer: Self-pay | Admitting: Registered Nurse

## 2020-10-05 ENCOUNTER — Other Ambulatory Visit: Payer: Self-pay

## 2020-10-05 ENCOUNTER — Ambulatory Visit: Payer: 59 | Admitting: Registered Nurse

## 2020-10-05 VITALS — BP 106/72 | HR 72 | Temp 98.4°F | Resp 18 | Ht 61.0 in | Wt 119.4 lb

## 2020-10-05 DIAGNOSIS — Z23 Encounter for immunization: Secondary | ICD-10-CM

## 2020-10-05 DIAGNOSIS — R1013 Epigastric pain: Secondary | ICD-10-CM

## 2020-10-05 DIAGNOSIS — Z7689 Persons encountering health services in other specified circumstances: Secondary | ICD-10-CM

## 2020-10-05 NOTE — Patient Instructions (Signed)
° ° ° °  If you have lab work done today you will be contacted with your lab results within the next 2 weeks.  If you have not heard from us then please contact us. The fastest way to get your results is to register for My Chart. ° ° °IF you received an x-ray today, you will receive an invoice from [facility]. Please contact [facility] at [phone] with questions or concerns regarding your invoice.  ° °IF you received labwork today, you will receive an invoice from [facility]. Please contact [facility] at [phone] with questions or concerns regarding your invoice.  ° °Our billing staff will not be able to assist you with questions regarding bills from these companies. ° °You will be contacted with the lab results as soon as they are available. The fastest way to get your results is to activate your My Chart account. Instructions are located on the last page of this paperwork. If you have not heard from us regarding the results in 2 weeks, please contact this office. °  ° ° ° °

## 2020-10-05 NOTE — Progress Notes (Signed)
Established Patient Office Visit  Subjective:  Patient ID: Phyllis Morton, female    DOB: [birth year]  Age: 44 y.o. MRN: [id number]  CC:  Chief Complaint  Patient presents with  . Transitions Of Care    Patient is here for TOC. PAtient states she would also like to discuss how she has some Stomach pain.     HPI Phyllis Morton presents for [facility] and ongoing epigastric pain  Pain is worst after eating. Has been ongoing for months. Last time she took PPI or other treatment was 2 months+ ago. No melena, no symptoms of anemia, denies nvd. Cannot identify food triggers Notes that she does have a remote hx of H pylori gastritis.  Past Medical History:  Diagnosis Date  . Helicobacter pylori gastritis   . Miscarriage 06/29/2012   LMP 04/10/2012    Past Surgical History:  Procedure Laterality Date  . CESAREAN SECTION      Family History  Problem Relation Age of Onset  . Hypertension Mother     Social History   Socioeconomic History  . Marital status: Married    Spouse name: [name]  . Number of children: 1  . Years of education: 77  . Highest education level: Not on file  Occupational History  . Occupation: NAIL TECHNICIAN  Tobacco Use  . Smoking status: Never Smoker  . Smokeless tobacco: Never Used  Vaping Use  . Vaping Use: Never used  Substance and Sexual Activity  . Alcohol use: No  . Drug use: No  . Sexual activity: Yes    Partners: Male    Birth control/protection: None  Other Topics Concern  . Not on file  Social History Narrative   From [place].  In the [place] since 2008.   Social Determinants of Health   Financial Resource Strain:   . Difficulty of Paying Living Expenses: Not on file  Food Insecurity:   . Worried About Programme researcher, broadcasting/film/video in the Last Year: Not on file  . Ran Out of Food in the Last Year: Not on file  Transportation Needs:   . Lack of Transportation (Medical): Not on file  . Lack of Transportation (Non-Medical): Not on file  Physical  Activity:   . Days of Exercise per Week: Not on file  . Minutes of Exercise per Session: Not on file  Stress:   . Feeling of Stress : Not on file  Social Connections:   . Frequency of Communication with Friends and Family: Not on file  . Frequency of Social Gatherings with Friends and Family: Not on file  . Attends Religious Services: Not on file  . Active Member of Clubs or Organizations: Not on file  . Attends Banker Meetings: Not on file  . Marital Status: Not on file  Intimate Partner Violence:   . Fear of Current or Ex-Partner: Not on file  . Emotionally Abused: Not on file  . Physically Abused: Not on file  . Sexually Abused: Not on file    Outpatient Medications Prior to Visit  Medication Sig Dispense Refill  . clarithromycin (BIAXIN) 500 MG tablet Take 1 tablet (500 mg total) by mouth 2 (two) times daily. (Patient not taking: Reported on 02/15/2018) 20 tablet 0  . dicyclomine (BENTYL) 20 MG tablet Take 1 tablet (20 mg total) by mouth 3 (three) times daily as needed for spasms. (Patient not taking: Reported on 01/08/2020) 30 tablet 1  . ipratropium (ATROVENT) 0.03 % nasal spray Place  2 sprays into the nose 2 (two) times daily. 30 mL 5  . lansoprazole (PREVACID) 30 MG capsule Take 1 capsule (30 mg total) by mouth 2 (two) times daily before a meal. (Patient not taking: Reported on 10/05/2020) 20 capsule 0  . ondansetron (ZOFRAN) 4 MG tablet Take 1 tablet (4 mg total) by mouth every 8 (eight) hours as needed for nausea or vomiting. (Patient not taking: Reported on 10/05/2020) 20 tablet 0  . sucralfate (CARAFATE) 1 g tablet TAKE 1 TABLET(1 GRAM) BY MOUTH FOUR TIMES DAILY AT BEDTIME WITH MEALS (Patient not taking: Reported on 10/05/2020) 40 tablet 0  . traMADol (ULTRAM) 50 MG tablet Take 1 tablet (50 mg total) by mouth every 12 (twelve) hours as needed for severe pain. May cause constipation (Patient not taking: Reported on 10/05/2020) 20 tablet 0   Facility-Administered  Medications Prior to Visit  Medication Dose Route Frequency Provider Last Rate Last Admin  . gi cocktail (Maalox,Lidocaine,Donnatal)  30 mL Oral Once Le, Thao P, DO        No Known Allergies  ROS Review of Systems  Constitutional: Negative.   HENT: Negative.   Eyes: Negative.   Respiratory: Negative.   Cardiovascular: Negative.   Gastrointestinal: Positive for abdominal pain. Negative for abdominal distention, anal bleeding, blood in stool, constipation, diarrhea, nausea, rectal pain and vomiting.  Genitourinary: Negative.   Musculoskeletal: Negative.   Skin: Negative.   Neurological: Negative.   Psychiatric/Behavioral: Negative.       Objective:    Physical Exam Vitals and nursing note reviewed.  Constitutional:      General: She is not in acute distress.    Appearance: Normal appearance. She is normal weight. She is not ill-appearing, toxic-appearing or diaphoretic.  Cardiovascular:     Rate and Rhythm: Normal rate and regular rhythm.     Heart sounds: Normal heart sounds. No murmur heard.  No friction rub. No gallop.   Pulmonary:     Effort: Pulmonary effort is normal. No respiratory distress.     Breath sounds: Normal breath sounds. No stridor. No wheezing, rhonchi or rales.  Chest:     Chest wall: No tenderness.  Abdominal:     General: Abdomen is flat. Bowel sounds are normal. There is no distension.     Palpations: There is no mass.     Tenderness: There is no abdominal tenderness.  Skin:    General: Skin is warm and dry.  Neurological:     General: No focal deficit present.     Mental Status: She is alert and oriented to person, place, and time. Mental status is at baseline.  Psychiatric:        Mood and Affect: Mood normal.        Behavior: Behavior normal.        Thought Content: Thought content normal.        Judgment: Judgment normal.     BP 106/72   Pulse 72   Temp 98.4 F (36.9 C) (Temporal)   Resp 18   Ht 5\' 1"  (1.549 m)   Wt 119 lb 6.4 oz  (54.2 kg)   SpO2 94%   BMI 22.56 kg/m  Wt Readings from Last 3 Encounters:  10/05/20 119 lb 6.4 oz (54.2 kg)  07/21/20 117 lb (53.1 kg)  01/08/20 117 lb (53.1 kg)     Health Maintenance Due  Topic Date Due  . INFLUENZA VACCINE  07/12/2020    There are no preventive care reminders to display  for this patient.  No results found for: TSH Lab Results  Component Value Date   WBC 6.8 07/21/2020   HGB 10.4 (L) 07/21/2020   HCT 35.1 07/21/2020   MCV 70 (L) 07/21/2020   PLT 389 07/21/2020   Lab Results  Component Value Date   NA 139 07/21/2020   K 4.5 07/21/2020   CO2 21 07/21/2020   GLUCOSE 90 07/21/2020   BUN 10 07/21/2020   CREATININE 0.64 07/21/2020   BILITOT 0.2 07/21/2020   ALKPHOS 62 07/21/2020   AST 9 07/21/2020   ALT 4 07/21/2020   PROT 7.3 07/21/2020   ALBUMIN 4.1 07/21/2020   CALCIUM 8.9 07/21/2020   ANIONGAP 7 12/15/2015   No results found for: CHOL No results found for: HDL No results found for: LDLCALC No results found for: TRIG No results found for: CHOLHDL No results found for: PIRJ1O    Assessment & Plan:   Problem List Items Addressed This Visit    None    Visit Diagnoses    Abdominal pain, epigastric    -  Primary   Relevant Orders   H. pylori Breath Collection   CBC with Differential   Iron, TIBC and Ferritin Panel   Flu vaccine need       Relevant Orders   Flu Vaccine QUAD 6+ mos PF IM (Fluarix Quad PF)      No orders of the defined types were placed in this encounter.   Follow-up: No follow-ups on file.   PLAN  Feel that this is likely H pylori gastritis - will check breath test today  Will follow up on low cbc counts  Histories reviewed with patient  Patient encouraged to call clinic with any questions, comments, or concerns.  Janeece Agee, NP

## 2020-10-06 LAB — CBC WITH DIFFERENTIAL/PLATELET
Basophils Absolute: 0.1 10*3/uL (ref 0.0–0.2)
Basos: 1 %
EOS (ABSOLUTE): 0.1 10*3/uL (ref 0.0–0.4)
Eos: 2 %
Hematocrit: 38.1 % (ref 34.0–46.6)
Hemoglobin: 11.9 g/dL (ref 11.1–15.9)
Immature Grans (Abs): 0 10*3/uL (ref 0.0–0.1)
Immature Granulocytes: 0 %
Lymphocytes Absolute: 2.5 10*3/uL (ref 0.7–3.1)
Lymphs: 41 %
MCH: 23.6 pg — ABNORMAL LOW (ref 26.6–33.0)
MCHC: 31.2 g/dL — ABNORMAL LOW (ref 31.5–35.7)
MCV: 76 fL — ABNORMAL LOW (ref 79–97)
Monocytes Absolute: 0.6 10*3/uL (ref 0.1–0.9)
Monocytes: 10 %
Neutrophils Absolute: 2.8 10*3/uL (ref 1.4–7.0)
Neutrophils: 46 %
Platelets: 317 10*3/uL (ref 150–450)
RBC: 5.04 x10E6/uL (ref 3.77–5.28)
RDW: 17.7 % — ABNORMAL HIGH (ref 11.7–15.4)
WBC: 6 10*3/uL (ref 3.4–10.8)

## 2020-10-06 LAB — IRON,TIBC AND FERRITIN PANEL
Ferritin: 10 ng/mL — ABNORMAL LOW (ref 15–150)
Iron Saturation: 5 % — CL (ref 15–55)
Iron: 22 ug/dL — ABNORMAL LOW (ref 27–159)
Total Iron Binding Capacity: 468 ug/dL — ABNORMAL HIGH (ref 250–450)
UIBC: 446 ug/dL — ABNORMAL HIGH (ref 131–425)

## 2020-10-08 LAB — H. PYLORI BREATH TEST: H pylori Breath Test: POSITIVE — AB

## 2020-10-08 LAB — SPECIMEN STATUS REPORT

## 2020-10-08 LAB — H. PYLORI BREATH COLLECTION

## 2020-10-21 ENCOUNTER — Other Ambulatory Visit: Payer: Self-pay | Admitting: Registered Nurse

## 2020-10-21 DIAGNOSIS — A048 Other specified bacterial intestinal infections: Secondary | ICD-10-CM

## 2020-10-21 MED ORDER — PANTOPRAZOLE SODIUM 40 MG PO TBEC: 40.0000 mg | DELAYED_RELEASE_TABLET | Freq: Two times a day (BID) | ORAL | 0 refills | Status: DC

## 2020-10-21 MED ORDER — METRONIDAZOLE 500 MG PO TABS: 500.0000 mg | ORAL_TABLET | Freq: Three times a day (TID) | ORAL | 0 refills | Status: DC

## 2020-10-21 MED ORDER — BISMUTH SUBSALICYLATE 262 MG PO CHEW: 524.0000 mg | CHEWABLE_TABLET | Freq: Four times a day (QID) | ORAL | 0 refills | Status: DC

## 2020-10-21 MED ORDER — TETRACYCLINE HCL 500 MG PO CAPS: 500.0000 mg | ORAL_CAPSULE | Freq: Four times a day (QID) | ORAL | 0 refills | Status: DC

## 2020-10-26 DIAGNOSIS — Z8719 Personal history of other diseases of the digestive system: Secondary | ICD-10-CM | POA: Insufficient documentation

## 2020-10-26 DIAGNOSIS — B9681 Helicobacter pylori [H. pylori] as the cause of diseases classified elsewhere: Secondary | ICD-10-CM | POA: Insufficient documentation

## 2020-11-02 ENCOUNTER — Other Ambulatory Visit: Payer: Self-pay | Admitting: Registered Nurse

## 2020-11-02 DIAGNOSIS — A048 Other specified bacterial intestinal infections: Secondary | ICD-10-CM

## 2020-11-18 ENCOUNTER — Ambulatory Visit: Payer: 59 | Admitting: Registered Nurse

## 2020-11-18 ENCOUNTER — Other Ambulatory Visit: Payer: Self-pay

## 2020-11-18 ENCOUNTER — Encounter: Payer: Self-pay | Admitting: Registered Nurse

## 2020-11-18 DIAGNOSIS — A048 Other specified bacterial intestinal infections: Secondary | ICD-10-CM | POA: Diagnosis not present

## 2020-11-18 DIAGNOSIS — R1013 Epigastric pain: Secondary | ICD-10-CM | POA: Diagnosis not present

## 2020-11-18 MED ORDER — PANTOPRAZOLE SODIUM 40 MG PO TBEC: DELAYED_RELEASE_TABLET | ORAL | 3 refills | Status: DC

## 2020-11-18 MED ORDER — SUCRALFATE 1 G PO TABS: 1.0000 g | ORAL_TABLET | Freq: Three times a day (TID) | ORAL | 0 refills | Status: DC

## 2020-11-18 NOTE — Patient Instructions (Signed)
° ° ° °  If you have lab work done today you will be contacted with your lab results within the next 2 weeks.  If you have not heard from us then please contact us. The fastest way to get your results is to register for My Chart. ° ° °IF you received an x-ray today, you will receive an invoice from [facility]. Please contact  Radiology at [phone] with questions or concerns regarding your invoice.  ° °IF you received labwork today, you will receive an invoice from [facility]. Please contact [facility] at [phone] with questions or concerns regarding your invoice.  ° °Our billing staff will not be able to assist you with questions regarding bills from these companies. ° °You will be contacted with the lab results as soon as they are available. The fastest way to get your results is to activate your My Chart account. Instructions are located on the last page of this paperwork. If you have not heard from us regarding the results in 2 weeks, please contact this office. °  ° ° ° °

## 2020-11-18 NOTE — Progress Notes (Signed)
Established Patient Office Visit  Subjective:  Patient ID: Phyllis Morton, female    DOB: 1976-07-26  Age: 44 y.o. MRN: [id number]  CC:  Chief Complaint  Patient presents with  . Follow-up    Patient states she has been having some abdominal pain for about 1 month. Per patient she was given some medication for her stomach and it worked until she no longer had medicine.    HPI JAQUESHA Morton presents for ongoing epigastric pain  Completed clarithromycin triple therapy with good compliance and toleration. Notes about 50% improvement in pain Notes more esophageal pain now, as well as some epigastric.  Notably worse since being off of pantoprazole No stool changes No symptoms of anemia No dysphagia that she can identify No further concerns.  Past Medical History:  Diagnosis Date  . Helicobacter pylori gastritis   . Miscarriage 06/29/2012   LMP 04/10/2012    Past Surgical History:  Procedure Laterality Date  . CESAREAN SECTION      Family History  Problem Relation Age of Onset  . Hypertension Mother     Social History   Socioeconomic History  . Marital status: Married    Spouse name: [name]  . Number of children: 1  . Years of education: 31  . Highest education level: Not on file  Occupational History  . Occupation: NAIL TECHNICIAN  Tobacco Use  . Smoking status: Never Smoker  . Smokeless tobacco: Never Used  Vaping Use  . Vaping Use: Never used  Substance and Sexual Activity  . Alcohol use: No  . Drug use: No  . Sexual activity: Yes    Partners: Male    Birth control/protection: None  Other Topics Concern  . Not on file  Social History Narrative   From [place].  In the [place] since 2008.   Social Determinants of Health   Financial Resource Strain:   . Difficulty of Paying Living Expenses: Not on file  Food Insecurity:   . Worried About Programme researcher, broadcasting/film/video in the Last Year: Not on file  . Ran Out of Food in the Last Year: Not on file  Transportation  Needs:   . Lack of Transportation (Medical): Not on file  . Lack of Transportation (Non-Medical): Not on file  Physical Activity:   . Days of Exercise per Week: Not on file  . Minutes of Exercise per Session: Not on file  Stress:   . Feeling of Stress : Not on file  Social Connections:   . Frequency of Communication with Friends and Family: Not on file  . Frequency of Social Gatherings with Friends and Family: Not on file  . Attends Religious Services: Not on file  . Active Member of Clubs or Organizations: Not on file  . Attends Banker Meetings: Not on file  . Marital Status: Not on file  Intimate Partner Violence:   . Fear of Current or Ex-Partner: Not on file  . Emotionally Abused: Not on file  . Physically Abused: Not on file  . Sexually Abused: Not on file    Outpatient Medications Prior to Visit  Medication Sig Dispense Refill  . ipratropium (ATROVENT) 0.03 % nasal spray Place 2 sprays into the nose 2 (two) times daily. 30 mL 5  . bismuth subsalicylate (PEPTO BISMOL) 262 MG chewable tablet Chew 2 tablets (524 mg total) by mouth in the morning, at noon, in the evening, and at bedtime. (Patient not taking: Reported on 11/18/2020) 112 tablet  0  . clarithromycin (BIAXIN) 500 MG tablet Take 1 tablet (500 mg total) by mouth 2 (two) times daily. (Patient not taking: Reported on 02/15/2018) 20 tablet 0  . dicyclomine (BENTYL) 20 MG tablet Take 1 tablet (20 mg total) by mouth 3 (three) times daily as needed for spasms. (Patient not taking: Reported on 01/08/2020) 30 tablet 1  . metroNIDAZOLE (FLAGYL) 500 MG tablet Take 1 tablet (500 mg total) by mouth 3 (three) times daily. (Patient not taking: Reported on 11/18/2020) 42 tablet 0  . ondansetron (ZOFRAN) 4 MG tablet Take 1 tablet (4 mg total) by mouth every 8 (eight) hours as needed for nausea or vomiting. (Patient not taking: Reported on 10/05/2020) 20 tablet 0  . pantoprazole (PROTONIX) 40 MG tablet TAKE 1 TABLET(40 MG) BY MOUTH  TWICE DAILY (Patient not taking: Reported on 11/18/2020) 28 tablet 0  . sucralfate (CARAFATE) 1 g tablet TAKE 1 TABLET(1 GRAM) BY MOUTH FOUR TIMES DAILY AT BEDTIME WITH MEALS (Patient not taking: Reported on 10/05/2020) 40 tablet 0  . tetracycline (SUMYCIN) 500 MG capsule Take 1 capsule (500 mg total) by mouth 4 (four) times daily. (Patient not taking: Reported on 11/18/2020) 56 capsule 0  . traMADol (ULTRAM) 50 MG tablet Take 1 tablet (50 mg total) by mouth every 12 (twelve) hours as needed for severe pain. May cause constipation (Patient not taking: Reported on 10/05/2020) 20 tablet 0   Facility-Administered Medications Prior to Visit  Medication Dose Route Frequency Provider Last Rate Last Admin  . gi cocktail (Maalox,Lidocaine,Donnatal)  30 mL Oral Once Le, Thao P, DO        No Known Allergies  ROS Review of Systems  Constitutional: Negative.   HENT: Negative.   Eyes: Negative.   Respiratory: Negative.   Cardiovascular: Negative.   Gastrointestinal: Positive for abdominal pain (epigastric an esophageal). Negative for abdominal distention, anal bleeding, blood in stool, constipation, diarrhea, nausea, rectal pain and vomiting.  Genitourinary: Negative.   Musculoskeletal: Negative.   Skin: Negative.   Neurological: Negative.   Psychiatric/Behavioral: Negative.       Objective:    Physical Exam Vitals and nursing note reviewed.  Constitutional:      General: She is not in acute distress.    Appearance: Normal appearance. She is normal weight. She is not ill-appearing, toxic-appearing or diaphoretic.  Cardiovascular:     Rate and Rhythm: Normal rate and regular rhythm.     Heart sounds: Normal heart sounds. No murmur heard.  No friction rub. No gallop.   Pulmonary:     Effort: Pulmonary effort is normal. No respiratory distress.     Breath sounds: Normal breath sounds. No stridor. No wheezing, rhonchi or rales.  Chest:     Chest wall: No tenderness.  Abdominal:     General:  Abdomen is flat. Bowel sounds are normal. There is no distension.     Palpations: There is no mass.     Tenderness: There is no abdominal tenderness. There is no guarding.  Skin:    General: Skin is warm and dry.  Neurological:     General: No focal deficit present.     Mental Status: She is alert and oriented to person, place, and time. Mental status is at baseline.  Psychiatric:        Mood and Affect: Mood normal.        Behavior: Behavior normal.        Thought Content: Thought content normal.  Judgment: Judgment normal.     BP 112/73   Pulse 84   Temp 98 F (36.7 C) (Temporal)   Resp 18   Ht 5\' 1"  (1.549 m)   Wt 119 lb 6.4 oz (54.2 kg)   SpO2 96%   BMI 22.56 kg/m  Wt Readings from Last 3 Encounters:  11/18/20 119 lb 6.4 oz (54.2 kg)  10/05/20 119 lb 6.4 oz (54.2 kg)  07/21/20 117 lb (53.1 kg)     There are no preventive care reminders to display for this patient.  There are no preventive care reminders to display for this patient.  No results found for: TSH Lab Results  Component Value Date   WBC 6.0 10/05/2020   HGB 11.9 10/05/2020   HCT 38.1 10/05/2020   MCV 76 (L) 10/05/2020   PLT 317 10/05/2020   Lab Results  Component Value Date   NA 139 07/21/2020   K 4.5 07/21/2020   CO2 21 07/21/2020   GLUCOSE 90 07/21/2020   BUN 10 07/21/2020   CREATININE 0.64 07/21/2020   BILITOT 0.2 07/21/2020   ALKPHOS 62 07/21/2020   AST 9 07/21/2020   ALT 4 07/21/2020   PROT 7.3 07/21/2020   ALBUMIN 4.1 07/21/2020   CALCIUM 8.9 07/21/2020   ANIONGAP 7 12/15/2015   No results found for: CHOL No results found for: HDL No results found for: LDLCALC No results found for: TRIG No results found for: CHOLHDL No results found for: 02/12/2016    Assessment & Plan:   Problem List Items Addressed This Visit    None    Visit Diagnoses    H. pylori infection       Relevant Medications   pantoprazole (PROTONIX) 40 MG tablet   Other Relevant Orders   Ambulatory  referral to Gastroenterology   Abdominal pain, epigastric       Relevant Medications   sucralfate (CARAFATE) 1 g tablet   Other Relevant Orders   Ambulatory referral to Gastroenterology      Meds ordered this encounter  Medications  . pantoprazole (PROTONIX) 40 MG tablet    Sig: TAKE 1 TABLET(40 MG) BY MOUTH TWICE DAILY    Dispense:  90 tablet    Refill:  3    Order Specific Question:   Supervising Provider    Answer:   [id number], JEFFREY R [[id number]]  . sucralfate (CARAFATE) 1 g tablet    Sig: Take 1 tablet (1 g total) by mouth 4 (four) times daily -  with meals and at bedtime.    Dispense:  40 tablet    Refill:  0    Order Specific Question:   Supervising Provider    Answer:   Neva Seat, JEFFREY R [[id number]]    Follow-up: No follow-ups on file.   PLAN  Concern for incomplete eradication vs epigastric hernia vs cellular changes in esophagus  Without confirmed eradication and given recent PPI use, will refer to GI rather than pursue further h pylori testing at this time  May benefit from egd  Monitor for symptoms of anemia or stool changes, return to clinic or seek emergency care should red flags arise  Patient encouraged to call clinic with any questions, comments, or concerns.  June, NP

## 2020-11-25 ENCOUNTER — Other Ambulatory Visit: Payer: Self-pay | Admitting: Registered Nurse

## 2020-11-25 DIAGNOSIS — R1013 Epigastric pain: Secondary | ICD-10-CM

## 2020-11-25 NOTE — Telephone Encounter (Signed)
   Notes to clinic: Looks like this was a short term medication Please review for refill   Requested Prescriptions  Pending Prescriptions Disp Refills   sucralfate (CARAFATE) 1 g tablet [Pharmacy Med Name: SUCRALFATE 1GM TABLETS] 40 tablet 0    Sig: TAKE 1 TABLET(1 GRAM) BY MOUTH FOUR TIMES DAILY AT BEDTIME WITH MEALS      Gastroenterology: Antiacids Passed - 11/25/2020 10:48 AM      Passed - Valid encounter within last 12 months    Recent Outpatient Visits           1 week ago H. pylori infection   [facility], Gerlene Burdock, NP   1 month ago Abdominal pain, epigastric   [facility], Gerlene Burdock, NP   4 months ago Generalized abdominal pain   [facility], Gerlene Burdock, NP   10 months ago Generalized abdominal pain   [facility], Gerlene Burdock, NP   2 years ago Generalized abdominal pain   [facility], [place], MD

## 2021-02-09 ENCOUNTER — Encounter: Payer: Self-pay | Admitting: Registered Nurse

## 2021-02-09 ENCOUNTER — Other Ambulatory Visit: Payer: Self-pay

## 2021-02-09 ENCOUNTER — Ambulatory Visit (INDEPENDENT_AMBULATORY_CARE_PROVIDER_SITE_OTHER): Payer: 59 | Admitting: Registered Nurse

## 2021-02-09 VITALS — BP 107/74 | HR 101 | Temp 98.0°F | Resp 18 | Ht 61.0 in | Wt 113.0 lb

## 2021-02-09 DIAGNOSIS — D509 Iron deficiency anemia, unspecified: Secondary | ICD-10-CM | POA: Diagnosis not present

## 2021-02-09 DIAGNOSIS — R1084 Generalized abdominal pain: Secondary | ICD-10-CM | POA: Diagnosis not present

## 2021-02-09 DIAGNOSIS — R1013 Epigastric pain: Secondary | ICD-10-CM

## 2021-02-09 DIAGNOSIS — R1114 Bilious vomiting: Secondary | ICD-10-CM

## 2021-02-09 DIAGNOSIS — R109 Unspecified abdominal pain: Secondary | ICD-10-CM | POA: Diagnosis not present

## 2021-02-09 DIAGNOSIS — A048 Other specified bacterial intestinal infections: Secondary | ICD-10-CM

## 2021-02-09 DIAGNOSIS — Z8619 Personal history of other infectious and parasitic diseases: Secondary | ICD-10-CM

## 2021-02-09 MED ORDER — PANTOPRAZOLE SODIUM 40 MG PO TBEC
40.0000 mg | DELAYED_RELEASE_TABLET | Freq: Every day | ORAL | 3 refills | Status: DC
Start: 2021-02-09 — End: 2022-12-03

## 2021-02-09 MED ORDER — SUCRALFATE 1 G PO TABS: 1.0000 g | ORAL_TABLET | Freq: Three times a day (TID) | ORAL | 0 refills | Status: AC

## 2021-02-09 NOTE — Progress Notes (Signed)
Established Patient Office Visit  Subjective:  Patient ID: Phyllis Morton, female    DOB: 1976/01/29  Age: 45 y.o. MRN: [id number]  CC:  Chief Complaint  Patient presents with  . Follow-up    Patient states she is still having some stomach pain she was taking pantoprazole and she has no more and the pain came back.    HPI Phyllis Morton presents for abdominal pain Recurred. Epigastric pain. Hx of h pylori infection, was not retested as she stayed on pantoprazole. Has been off of this for a few weeks at this time.  Does note that pain radiates to RUQ and around to right flank. No urinary or bowel changes. Does note that she has bilious emesis occasionally - 1-2 times in the past week. Has had episodes of streaks of blood in sputum after coughing. Unsure if this is related to recent viral URI or GI symptoms. Unintended weight loss of 10 lbs in past month. Notes that she has been eating a lot less due to epigastric pain, but this is still concerning for her.   Past Medical History:  Diagnosis Date  . Helicobacter pylori gastritis   . Miscarriage 06/29/2012   LMP 04/10/2012    Past Surgical History:  Procedure Laterality Date  . CESAREAN SECTION      Family History  Problem Relation Age of Onset  . Hypertension Mother     Social History   Socioeconomic History  . Marital status: Married    Spouse name: [name]  . Number of children: 1  . Years of education: 7  . Highest education level: Not on file  Occupational History  . Occupation: NAIL TECHNICIAN  Tobacco Use  . Smoking status: Never Smoker  . Smokeless tobacco: Never Used  Vaping Use  . Vaping Use: Never used  Substance and Sexual Activity  . Alcohol use: No  . Drug use: No  . Sexual activity: Yes    Partners: Male    Birth control/protection: None  Other Topics Concern  . Not on file  Social History Narrative   From [place].  In the [place] since 2008.   Social Determinants of Health   Financial Resource  Strain: Not on file  Food Insecurity: Not on file  Transportation Needs: Not on file  Physical Activity: Not on file  Stress: Not on file  Social Connections: Not on file  Intimate Partner Violence: Not on file    Outpatient Medications Prior to Visit  Medication Sig Dispense Refill  . ipratropium (ATROVENT) 0.03 % nasal spray Place 2 sprays into the nose 2 (two) times daily. 30 mL 5  . pantoprazole (PROTONIX) 40 MG tablet TAKE 1 TABLET(40 MG) BY MOUTH TWICE DAILY (Patient not taking: Reported on 02/09/2021) 90 tablet 3  . sucralfate (CARAFATE) 1 g tablet TAKE 1 TABLET(1 GRAM) BY MOUTH FOUR TIMES DAILY AT BEDTIME WITH MEALS (Patient not taking: Reported on 02/09/2021) 40 tablet 0   Facility-Administered Medications Prior to Visit  Medication Dose Route Frequency Provider Last Rate Last Admin  . gi cocktail (Maalox,Lidocaine,Donnatal)  30 mL Oral Once Le, Thao P, DO        No Known Allergies  ROS Review of Systems Per hpi     Objective:    Physical Exam Vitals and nursing note reviewed.  Constitutional:      General: She is not in acute distress.    Appearance: Normal appearance. She is normal weight. She is not ill-appearing, toxic-appearing or  diaphoretic.  Cardiovascular:     Rate and Rhythm: Normal rate and regular rhythm.     Heart sounds: Normal heart sounds. No murmur heard. No friction rub. No gallop.   Pulmonary:     Effort: Pulmonary effort is normal. No respiratory distress.     Breath sounds: Normal breath sounds. No stridor. No wheezing, rhonchi or rales.  Chest:     Chest wall: No tenderness.  Abdominal:     General: Abdomen is flat. Bowel sounds are normal. There is no distension.     Palpations: Abdomen is soft. There is no mass.     Tenderness: There is abdominal tenderness in the right upper quadrant and epigastric area. There is no right CVA tenderness, left CVA tenderness, guarding or rebound.     Hernia: No hernia is present.    Skin:    General:  Skin is warm and dry.  Neurological:     General: No focal deficit present.     Mental Status: She is alert and oriented to person, place, and time. Mental status is at baseline.  Psychiatric:        Mood and Affect: Mood normal.        Behavior: Behavior normal.        Thought Content: Thought content normal.        Judgment: Judgment normal.     BP 107/74   Pulse (!) 101   Temp 98 F (36.7 C) (Temporal)   Resp 18   Ht 5\' 1"  (1.549 m)   Wt 113 lb (51.3 kg)   SpO2 99%   BMI 21.35 kg/m  Wt Readings from Last 3 Encounters:  02/09/21 113 lb (51.3 kg)  11/18/20 119 lb 6.4 oz (54.2 kg)  10/05/20 119 lb 6.4 oz (54.2 kg)     There are no preventive care reminders to display for this patient.  There are no preventive care reminders to display for this patient.  No results found for: TSH Lab Results  Component Value Date   WBC 6.0 10/05/2020   HGB 11.9 10/05/2020   HCT 38.1 10/05/2020   MCV 76 (L) 10/05/2020   PLT 317 10/05/2020   Lab Results  Component Value Date   NA 139 07/21/2020   K 4.5 07/21/2020   CO2 21 07/21/2020   GLUCOSE 90 07/21/2020   BUN 10 07/21/2020   CREATININE 0.64 07/21/2020   BILITOT 0.2 07/21/2020   ALKPHOS 62 07/21/2020   AST 9 07/21/2020   ALT 4 07/21/2020   PROT 7.3 07/21/2020   ALBUMIN 4.1 07/21/2020   CALCIUM 8.9 07/21/2020   ANIONGAP 7 12/15/2015   No results found for: CHOL No results found for: HDL No results found for: LDLCALC No results found for: TRIG No results found for: CHOLHDL No results found for: 02/12/2016    Assessment & Plan:   Problem List Items Addressed This Visit      Other   Generalized abdominal pain   Relevant Orders   CBC With Differential   Iron, TIBC and Ferritin Panel   H. pylori Breath Collection   Comprehensive metabolic panel   Urinalysis    Other Visit Diagnoses    History of Helicobacter pylori infection    -  Primary   Relevant Orders   CBC With Differential   Iron, TIBC and Ferritin Panel    H. pylori Breath Collection   Microcytic anemia       Relevant Orders   CBC With Differential  Iron, TIBC and Ferritin Panel   Right flank pain       Relevant Orders   Urinalysis   Abdominal pain, epigastric       Relevant Medications   sucralfate (CARAFATE) 1 g tablet   H. pylori infection       Relevant Medications   pantoprazole (PROTONIX) 40 MG tablet      Meds ordered this encounter  Medications  . sucralfate (CARAFATE) 1 g tablet    Sig: Take 1 tablet (1 g total) by mouth 4 (four) times daily -  with meals and at bedtime.    Dispense:  40 tablet    Refill:  0    Order Specific Question:   Supervising Provider    Answer:   Neva Seat, JEFFREY R [[id number]]  . pantoprazole (PROTONIX) 40 MG tablet    Sig: Take 1 tablet (40 mg total) by mouth daily. TAKE 1 TABLET(40 MG) BY MOUTH TWICE DAILY    Dispense:  90 tablet    Refill:  3    Order Specific Question:   Supervising Provider    Answer:   Neva Seat, JEFFREY R [[id number]]    Follow-up: No follow-ups on file.   PLAN  Suspect incomplete eradication of h pylori, giving her ongoing symptoms. Will order CT given RUQ pain and unintended weight loss, need to rule out malignancy.   Blood in sputum likely from recent URI rather than GI origin, but will monitor closely. No coffee ground emesis is reassuring.  Resume pantoprazole and sucralfate  Send out h pylori testing  Labs collected. Will follow up with the patient as warranted.  Patient encouraged to call clinic with any questions, comments, or concerns.  Janeece Agee, NP

## 2021-02-09 NOTE — Patient Instructions (Signed)
° ° ° °  If you have lab work done today you will be contacted with your lab results within the next 2 weeks.  If you have not heard from us then please contact us. The fastest way to get your results is to register for My Chart. ° ° °IF you received an x-ray today, you will receive an invoice from [facility]. Please contact [facility] at [phone] with questions or concerns regarding your invoice.  ° °IF you received labwork today, you will receive an invoice from [facility]. Please contact [facility] at [phone] with questions or concerns regarding your invoice.  ° °Our billing staff will not be able to assist you with questions regarding bills from these companies. ° °You will be contacted with the lab results as soon as they are available. The fastest way to get your results is to activate your My Chart account. Instructions are located on the last page of this paperwork. If you have not heard from us regarding the results in 2 weeks, please contact this office. °  ° ° ° °

## 2021-02-10 ENCOUNTER — Other Ambulatory Visit: Payer: Self-pay | Admitting: Registered Nurse

## 2021-02-10 DIAGNOSIS — R1114 Bilious vomiting: Secondary | ICD-10-CM

## 2021-02-10 DIAGNOSIS — R1084 Generalized abdominal pain: Secondary | ICD-10-CM

## 2021-02-10 LAB — COMPREHENSIVE METABOLIC PANEL
ALT: 9 IU/L (ref 0–32)
AST: 20 IU/L (ref 0–40)
Albumin/Globulin Ratio: 1.4 (ref 1.2–2.2)
Albumin: 4.4 g/dL (ref 3.8–4.8)
Alkaline Phosphatase: 67 IU/L (ref 44–121)
BUN/Creatinine Ratio: 16 (ref 9–23)
BUN: 12 mg/dL (ref 6–24)
Bilirubin Total: 0.3 mg/dL (ref 0.0–1.2)
CO2: 21 mmol/L (ref 20–29)
Calcium: 9.3 mg/dL (ref 8.7–10.2)
Chloride: 104 mmol/L (ref 96–106)
Creatinine, Ser: 0.76 mg/dL (ref 0.57–1.00)
Globulin, Total: 3.2 g/dL (ref 1.5–4.5)
Glucose: 106 mg/dL — ABNORMAL HIGH (ref 65–99)
Potassium: 4.5 mmol/L (ref 3.5–5.2)
Sodium: 140 mmol/L (ref 134–144)
Total Protein: 7.6 g/dL (ref 6.0–8.5)
eGFR: 98 mL/min/{1.73_m2} (ref >59)

## 2021-02-10 LAB — URINALYSIS
Bilirubin, UA: NEGATIVE
Glucose, UA: NEGATIVE
Ketones, UA: NEGATIVE
Leukocytes,UA: NEGATIVE
Nitrite, UA: NEGATIVE
Protein,UA: NEGATIVE
Specific Gravity, UA: 1.023 (ref 1.005–1.030)
Urobilinogen, Ur: 0.2 mg/dL (ref 0.2–1.0)
pH, UA: 6.5 (ref 5.0–7.5)

## 2021-02-10 LAB — CBC WITH DIFFERENTIAL
Basophils Absolute: 0.1 10*3/uL (ref 0.0–0.2)
Basos: 1 %
EOS (ABSOLUTE): 0.1 10*3/uL (ref 0.0–0.4)
Eos: 1 %
Hematocrit: 35.1 % (ref 34.0–46.6)
Hemoglobin: 10.9 g/dL — ABNORMAL LOW (ref 11.1–15.9)
Immature Grans (Abs): 0 10*3/uL (ref 0.0–0.1)
Immature Granulocytes: 0 %
Lymphocytes Absolute: 2.1 10*3/uL (ref 0.7–3.1)
Lymphs: 37 %
MCH: 24.4 pg — ABNORMAL LOW (ref 26.6–33.0)
MCHC: 31.1 g/dL — ABNORMAL LOW (ref 31.5–35.7)
MCV: 79 fL (ref 79–97)
Monocytes Absolute: 0.5 10*3/uL (ref 0.1–0.9)
Monocytes: 9 %
Neutrophils Absolute: 2.9 10*3/uL (ref 1.4–7.0)
Neutrophils: 52 %
RBC: 4.46 x10E6/uL (ref 3.77–5.28)
RDW: 13.8 % (ref 11.7–15.4)
WBC: 5.6 10*3/uL (ref 3.4–10.8)

## 2021-02-10 LAB — IRON,TIBC AND FERRITIN PANEL
Ferritin: 6 ng/mL — ABNORMAL LOW (ref 15–150)
Iron Saturation: 7 % — CL (ref 15–55)
Iron: 31 ug/dL (ref 27–159)
Total Iron Binding Capacity: 447 ug/dL (ref 250–450)
UIBC: 416 ug/dL (ref 131–425)

## 2021-02-10 NOTE — Progress Notes (Signed)
Hematuria noted on urinalysis. Given ongoing abdominal pain and weight loss, changing priority of CT abd pelvis to STAT.  Called patient to inform - no answer, will try again.   Jari Sportsman, NP

## 2021-02-10 NOTE — Progress Notes (Signed)
If we could call patient - labs show no urgent concerns but did show a little bit of blood in her urine. I have moved up the priority of her CT order to STAT. I would like her to get this done as soon as possible. I will follow up with her closely after that.   Thanks,  Luan Pulling

## 2021-02-11 ENCOUNTER — Ambulatory Visit
Admission: RE | Admit: 2021-02-11 | Discharge: 2021-02-11 | Disposition: A | Discharge status: Home / Self Care | Payer: 59 | Source: Ambulatory Visit | Attending: Registered Nurse | Admitting: Registered Nurse

## 2021-02-11 ENCOUNTER — Telehealth: Payer: Self-pay | Admitting: Emergency Medicine

## 2021-02-11 DIAGNOSIS — R1084 Generalized abdominal pain: Secondary | ICD-10-CM

## 2021-02-11 DIAGNOSIS — R1114 Bilious vomiting: Secondary | ICD-10-CM

## 2021-02-11 NOTE — Telephone Encounter (Signed)
Phyllis Morton they stated patient can walk in today to get her CT scan. She needs to be there before 12:00 today. Patient was informed. Stated she will be there by 11:00 today

## 2021-02-12 ENCOUNTER — Telehealth: Payer: Self-pay

## 2021-02-12 NOTE — Telephone Encounter (Signed)
I have called pt and informed her of the CT results.   She has not been referred to a specialist.   Please put that referral in.

## 2021-02-17 LAB — H. PYLORI BREATH COLLECTION

## 2021-02-17 LAB — H. PYLORI BREATH TEST

## 2021-02-17 LAB — SPECIMEN STATUS REPORT

## 2021-02-26 ENCOUNTER — Other Ambulatory Visit: Payer: Self-pay | Admitting: Registered Nurse

## 2021-02-26 DIAGNOSIS — R1114 Bilious vomiting: Secondary | ICD-10-CM

## 2021-02-26 DIAGNOSIS — Z8619 Personal history of other infectious and parasitic diseases: Secondary | ICD-10-CM

## 2021-02-26 DIAGNOSIS — R1084 Generalized abdominal pain: Secondary | ICD-10-CM

## 2021-10-02 IMAGING — CT CT ABD-PELV W/O CM
1 of 2 series · 15 of 32 positions shown, 20 images · non-contrast
Comparison: 12/16/2015

CLINICAL DATA: Right flank pain with hematuria.

EXAM:
CT ABDOMEN AND PELVIS WITHOUT CONTRAST
TECHNIQUE: Multidetector CT imaging of the abdomen and pelvis was performed
following the standard protocol without IV contrast.

[Series 2: abd/pelvis w/(date) · axial · 0.62mm/px · z∈[-442,-42]mm · 15 of 90 slices shown, 20 images]
[im 5/90  soft-tissue]
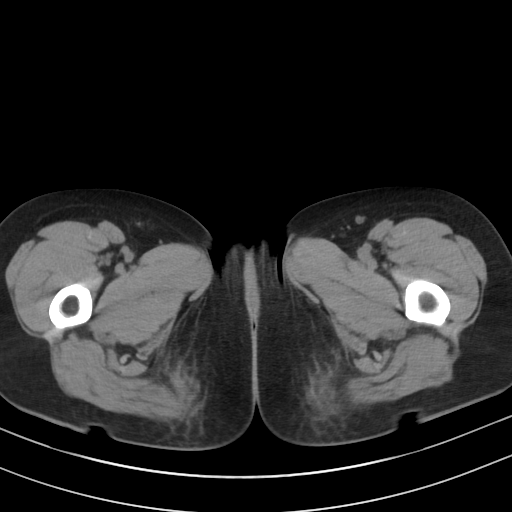
[im 5/90  bone]
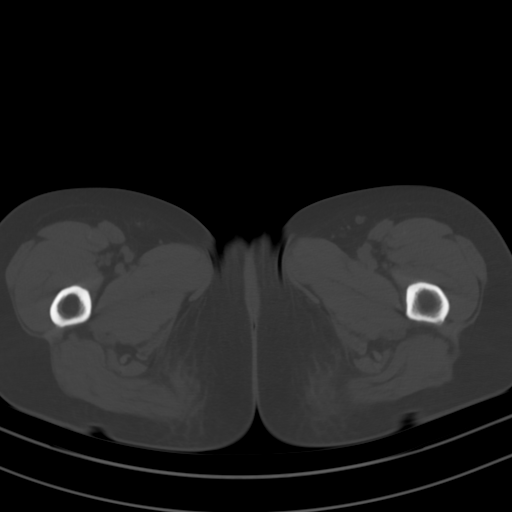
[im 10/90  soft-tissue]
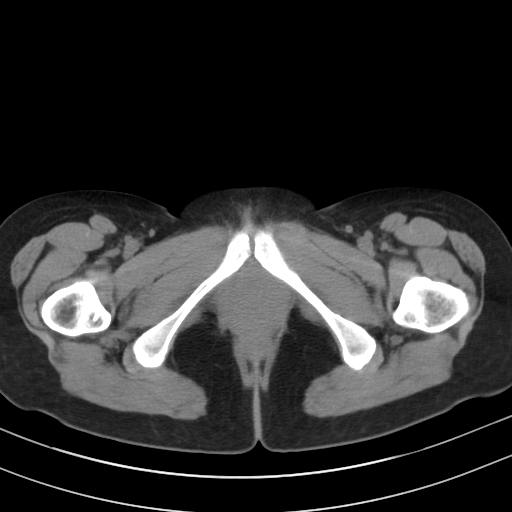
[im 20/90  soft-tissue]
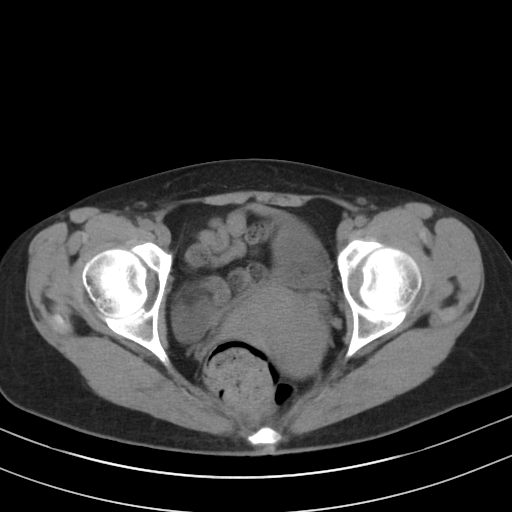
[im 25/90  soft-tissue]
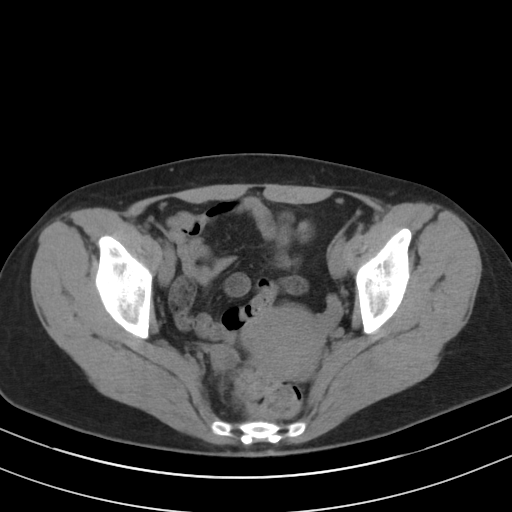
[im 30/90  soft-tissue]
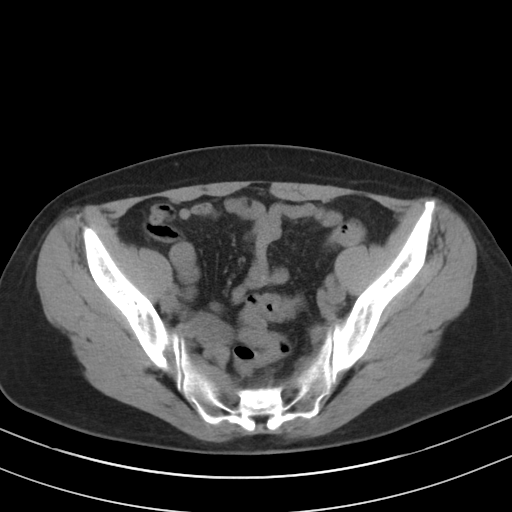
[im 35/90  soft-tissue]
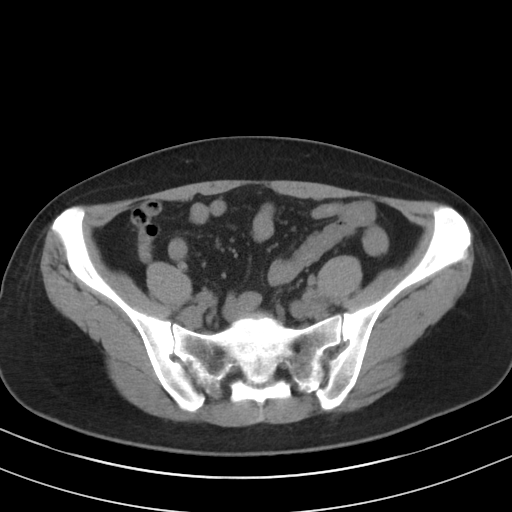
[im 40/90  soft-tissue]
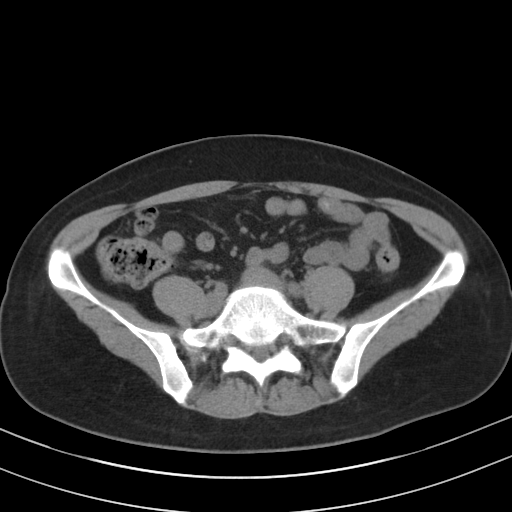
[im 50/90  soft-tissue]
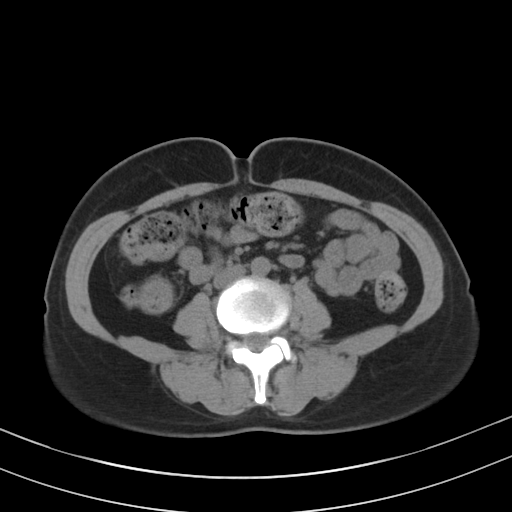
[im 55/90  soft-tissue]
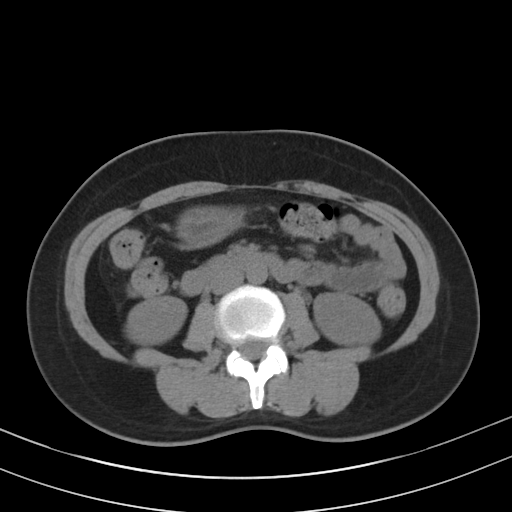
[im 55/90  bone]
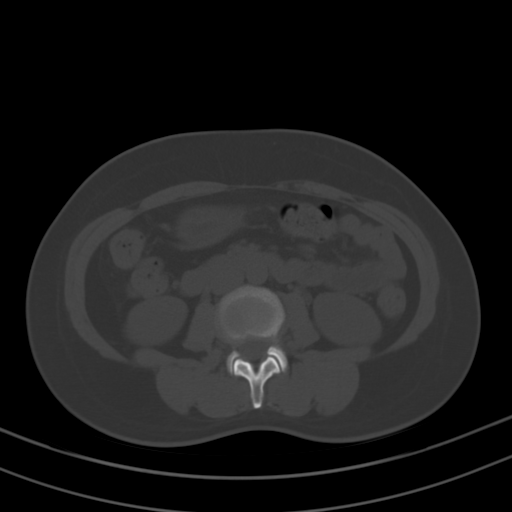
[im 60/90  soft-tissue]
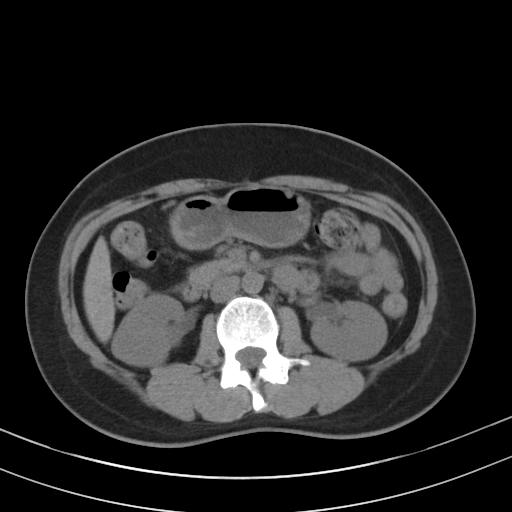
[im 65/90  soft-tissue]
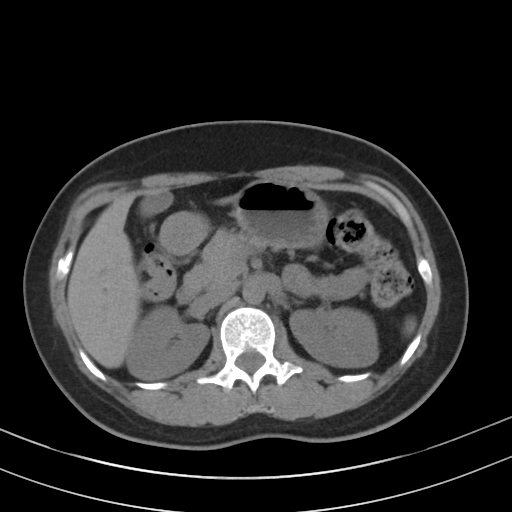
[im 70/90  soft-tissue]
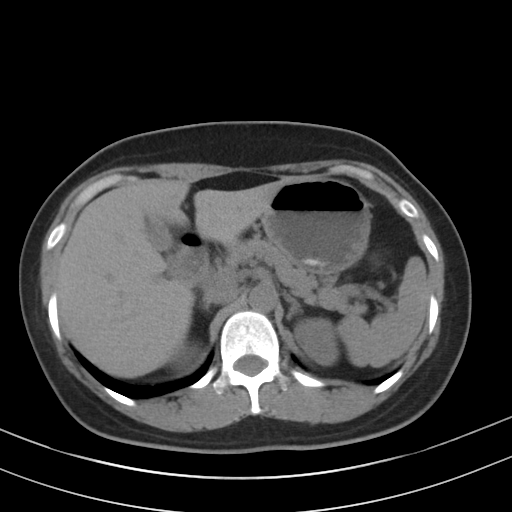
[im 70/90  lung]
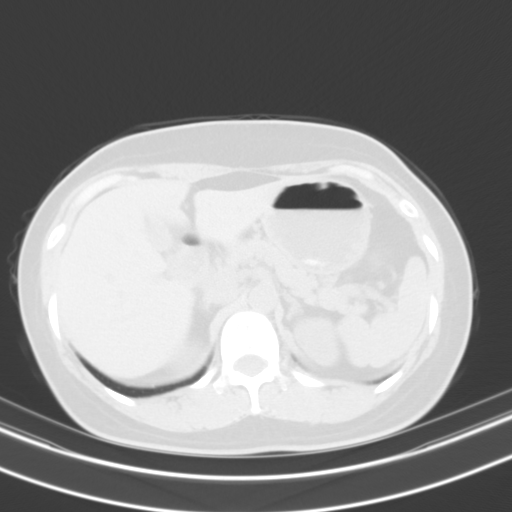
[im 75/90  lung]
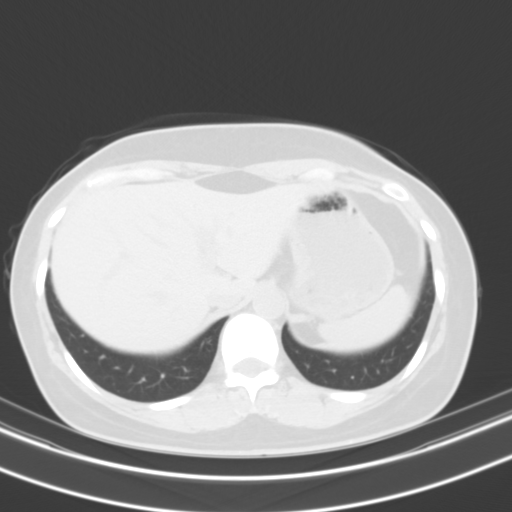
[im 80/90  soft-tissue]
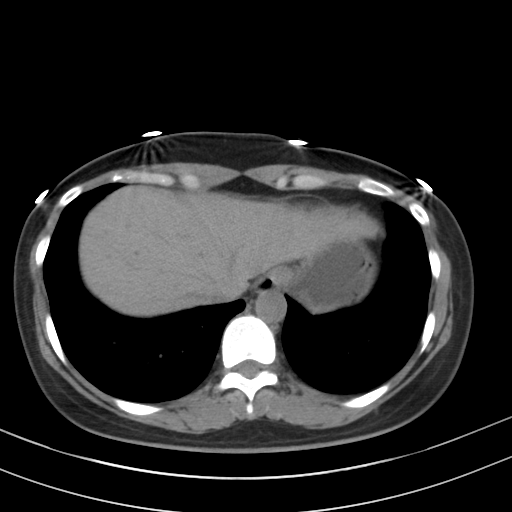
[im 80/90  lung]
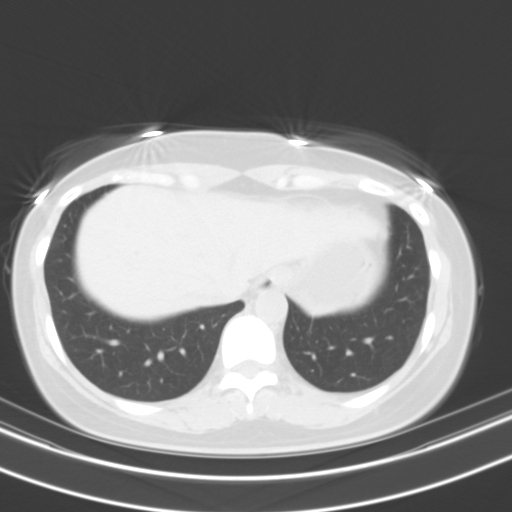
[im 85/90  soft-tissue]
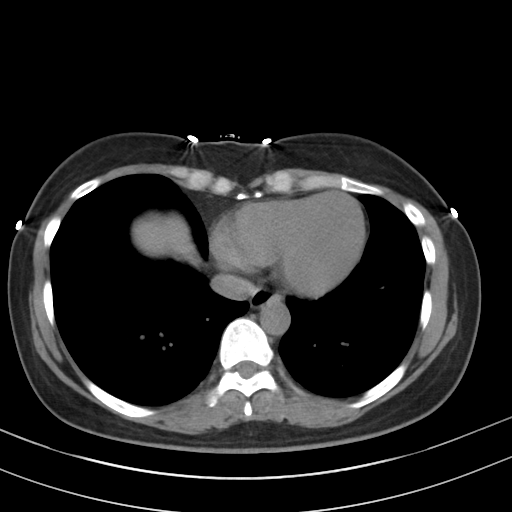
[im 85/90  lung]
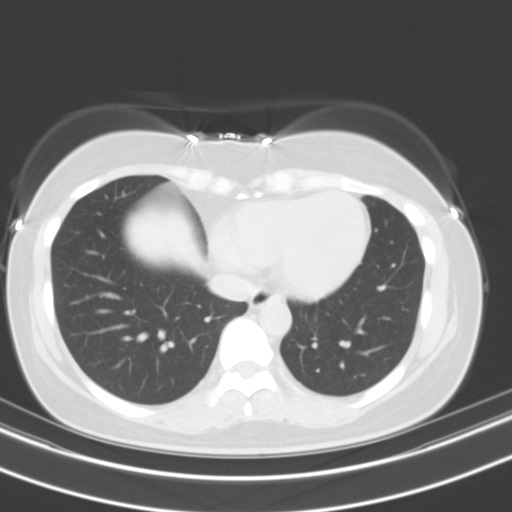

[15 of 32 positions shown; findings below may reference images not displayed]

FINDINGS: Lower chest:  No contributory findings.

Hepatobiliary: No focal liver abnormality.No evidence of biliary
obstruction or stone.

Pancreas: Unremarkable.

Spleen: Unremarkable.

Adrenals/Urinary Tract: Negative adrenals. No hydronephrosis or
stone. Unremarkable bladder.

Stomach/Bowel:  No obstruction. No appendicitis.

Vascular/Lymphatic: No acute vascular abnormality. No mass or
adenopathy.

Reproductive:No pathologic findings.

Other: No ascites or pneumoperitoneum.

Musculoskeletal: No acute abnormalities.
IMPRESSION: No hydronephrosis or stone.  No explanation for pain.

## 2022-12-03 ENCOUNTER — Emergency Department
Admission: EM | Admit: 2022-12-03 | Discharge: 2022-12-03 | Disposition: A | Discharge status: Home / Self Care | Payer: 59 | Attending: Emergency Medicine | Admitting: Emergency Medicine

## 2022-12-03 ENCOUNTER — Emergency Department: Payer: 59

## 2022-12-03 ENCOUNTER — Other Ambulatory Visit: Payer: Self-pay

## 2022-12-03 DIAGNOSIS — R1013 Epigastric pain: Secondary | ICD-10-CM | POA: Diagnosis not present

## 2022-12-03 DIAGNOSIS — R9431 Abnormal electrocardiogram [ECG] [EKG]: Secondary | ICD-10-CM | POA: Diagnosis not present

## 2022-12-03 DIAGNOSIS — R079 Chest pain, unspecified: Secondary | ICD-10-CM | POA: Diagnosis not present

## 2022-12-03 LAB — COMPREHENSIVE METABOLIC PANEL
ALT: 12 U/L (ref 0–44)
AST: 24 U/L (ref 15–41)
Albumin: 4.3 g/dL (ref 3.5–5.0)
Alkaline Phosphatase: 63 U/L (ref 38–126)
Anion gap: 8 (ref 5–15)
BUN: 16 mg/dL (ref 6–20)
CO2: 25 mmol/L (ref 22–32)
Calcium: 9.3 mg/dL (ref 8.9–10.3)
Chloride: 107 mmol/L (ref 98–111)
Creatinine, Ser: 0.55 mg/dL (ref 0.44–1.00)
GFR, Estimated: >60 mL/min (ref >60)
Glucose, Bld: 83 mg/dL (ref 70–99)
Potassium: 3.4 mmol/L — ABNORMAL LOW (ref 3.5–5.1)
Sodium: 140 mmol/L (ref 135–145)
Total Bilirubin: 0.6 mg/dL (ref 0.3–1.2)
Total Protein: 8.2 g/dL — ABNORMAL HIGH (ref 6.5–8.1)

## 2022-12-03 LAB — URINALYSIS, ROUTINE W REFLEX MICROSCOPIC
Bacteria, UA: NONE SEEN
Bilirubin Urine: NEGATIVE
Glucose, UA: NEGATIVE mg/dL
Hgb urine dipstick: NEGATIVE
Ketones, ur: NEGATIVE mg/dL
Nitrite: NEGATIVE
Protein, ur: NEGATIVE mg/dL
Specific Gravity, Urine: 1.013 (ref 1.005–1.030)
pH: 5 (ref 5.0–8.0)

## 2022-12-03 LAB — CBC WITH DIFFERENTIAL/PLATELET
Abs Immature Granulocytes: 0.03 10*3/uL (ref 0.00–0.07)
Basophils Absolute: 0.1 10*3/uL (ref 0.0–0.1)
Basophils Relative: 1 %
Eosinophils Absolute: 0.2 10*3/uL (ref 0.0–0.5)
Eosinophils Relative: 1 %
HCT: 37.8 % (ref 36.0–46.0)
Hemoglobin: 11.6 g/dL — ABNORMAL LOW (ref 12.0–15.0)
Immature Granulocytes: 0 %
Lymphocytes Relative: 25 %
Lymphs Abs: 2.9 10*3/uL (ref 0.7–4.0)
MCH: 24 pg — ABNORMAL LOW (ref 26.0–34.0)
MCHC: 30.7 g/dL (ref 30.0–36.0)
MCV: 78.3 fL — ABNORMAL LOW (ref 80.0–100.0)
Monocytes Absolute: 1.1 10*3/uL — ABNORMAL HIGH (ref 0.1–1.0)
Monocytes Relative: 10 %
Neutro Abs: 7.2 10*3/uL (ref 1.7–7.7)
Neutrophils Relative %: 63 %
Platelets: 296 10*3/uL (ref 150–400)
RBC: 4.83 MIL/uL (ref 3.87–5.11)
RDW: 13.4 % (ref 11.5–15.5)
WBC: 11.4 10*3/uL — ABNORMAL HIGH (ref 4.0–10.5)
nRBC: 0 % (ref 0.0–0.2)

## 2022-12-03 LAB — LIPASE, BLOOD: Lipase: 50 U/L (ref 11–51)

## 2022-12-03 LAB — PREGNANCY, URINE: Preg Test, Ur: NEGATIVE

## 2022-12-03 LAB — TROPONIN I (HIGH SENSITIVITY): Troponin I (High Sensitivity): <2 ng/L (ref <18)

## 2022-12-03 MED ORDER — PANTOPRAZOLE SODIUM 40 MG PO TBEC
40.0000 mg | DELAYED_RELEASE_TABLET | Freq: Once | ORAL | Status: AC
  Administered 2022-12-03: 40 mg via ORAL
  Filled 2022-12-03: qty 1

## 2022-12-03 MED ORDER — LIDOCAINE VISCOUS HCL 2 % MT SOLN
15.0000 mL | Freq: Once | OROMUCOSAL | Status: AC
  Administered 2022-12-03: 15 mL via ORAL
  Filled 2022-12-03: qty 15

## 2022-12-03 MED ORDER — ALUM & MAG HYDROXIDE-SIMETH 200-200-20 MG/5ML PO SUSP
30.0000 mL | Freq: Once | ORAL | Status: AC
  Administered 2022-12-03: 30 mL via ORAL
  Filled 2022-12-03: qty 30

## 2022-12-03 MED ORDER — PANTOPRAZOLE SODIUM 40 MG PO TBEC: 40.0000 mg | DELAYED_RELEASE_TABLET | Freq: Every day | ORAL | 0 refills | Status: AC

## 2022-12-03 NOTE — ED Provider Notes (Signed)
[facility] Provider Note    Event Date/Time   First MD Initiated Contact with Patient 12/03/22 1849     (approximate)   History   Abdominal Pain   HPI  Phyllis Morton is a 46 y.o. female   Past medical history of pylori gastritis, presents to the emergency department with epigastric pain radiating up to her throat burning sensation for the last week worse after eating.  Denies shortness of breath fever chills.  No blood or melena from stools.  No urinary symptoms.  Nausea but no vomiting.    She states that she has had an upper endoscopy which was negative in the past but I cannot find medical records of this.  History was obtained via the patient. I reviewed external medical notes including practitioner note office visit dated 11/18/2020 which evaluated her for epigastric pain and noted completion of triple therapy for H. pylori.      Physical Exam   Triage Vital Signs: ED Triage Vitals  Enc Vitals Group     BP 12/03/22 1642 123/80     Pulse Rate 12/03/22 1642 74     Resp 12/03/22 1642 16     Temp 12/03/22 1642 98.1 F (36.7 C)     Temp Source 12/03/22 1642 Oral     SpO2 12/03/22 1642 98 %     Weight 12/03/22 1643 125 lb (56.7 kg)     Height 12/03/22 1643 5\' 4"  (1.626 m)     Head Circumference --      Peak Flow --      Pain Score 12/03/22 1643 8     Pain Loc --      Pain Edu? --      Excl. in GC? --     Most recent vital signs: Vitals:   12/03/22 1642  BP: 123/80  Pulse: 74  Resp: 16  Temp: 98.1 F (36.7 C)  SpO2: 98%    General: Awake, no distress.  CV:  Good peripheral perfusion.  Resp:  Normal effort.  Abd:  No distention.  Other:  Abdomen soft, tenderness to the epigastrium, no rigidity.  No rash in the other quadrants.  Awake alert pleasant, hemodynamics appropriate and reassuring, comfortable appearance.   ED Results / Procedures / Treatments   Labs (all labs ordered are listed, but only abnormal results are  displayed) Labs Reviewed  COMPREHENSIVE METABOLIC PANEL - Abnormal; Notable for the following components:      Result Value   Potassium 3.4 (*)    Total Protein 8.2 (*)    All other components within normal limits  CBC WITH DIFFERENTIAL/PLATELET - Abnormal; Notable for the following components:   WBC 11.4 (*)    Hemoglobin 11.6 (*)    MCV 78.3 (*)    MCH 24.0 (*)    Monocytes Absolute 1.1 (*)    All other components within normal limits  URINALYSIS, ROUTINE W REFLEX MICROSCOPIC - Abnormal; Notable for the following components:   Color, Urine STRAW (*)    APPearance CLEAR (*)    Leukocytes,Ua SMALL (*)    All other components within normal limits  LIPASE, BLOOD  PREGNANCY, URINE  TROPONIN I (HIGH SENSITIVITY)  TROPONIN I (HIGH SENSITIVITY)     I reviewed labs and they are notable for hemoglobin of 11.6 was a consistent with baseline  EKG  ED ECG REPORT I, 12/05/22, the attending physician, personally viewed and interpreted this ECG.   Date: 12/03/2022  EKG Time:  1646  Rate: 77  Rhythm: Normal sinus rhythm  Axis: nl  Intervals:none  ST&T Change: No acute ischemic changes    RADIOLOGY I independently reviewed and interpreted no obvious pneumothorax, free air, focalities   PROCEDURES:  Critical Care performed: No  Procedures   MEDICATIONS ORDERED IN ED: Medications  alum & mag hydroxide-simeth (MAALOX/MYLANTA) 200-200-20 MG/5ML suspension 30 mL (has no administration in time range)    And  lidocaine (XYLOCAINE) 2 % viscous mouth solution 15 mL (has no administration in time range)  pantoprazole (PROTONIX) EC tablet 40 mg (has no administration in time range)    IMPRESSION / MDM / ASSESSMENT AND PLAN / ED COURSE  I reviewed the triage vital signs and the nursing notes.                              Differential diagnosis includes, but is not limited to, gastritis/GERD, H. pylori, ulcer, GI bleeding, biliary pathology, ACS, perforated viscous    MDM:  Broad well-appearing patient with recurrent epigastric pain history of H. pylori.  Exam is overall benign with some mild tenderness to palpation of epigastrium I doubt perforated viscus or other surgical abdominal pathology.  She denies bleeding in her hemoglobin is reassuring and actually improved from priors.  Right upper quadrant ultrasound shows no biliary pathology.  I prescribed her PPI, gave her GI cocktail, and made a referral to gastroenterology for potential endoscopy or H. pylori testing.   Patient's presentation is most consistent with acute presentation with potential threat to life or bodily function.       FINAL CLINICAL IMPRESSION(S) / ED DIAGNOSES   Final diagnoses:  Epigastric abdominal pain     Rx / DC Orders   ED Discharge Orders          Ordered    Ambulatory referral to Gastroenterology        12/03/22 1859    pantoprazole (PROTONIX) 40 MG tablet  Daily        12/03/22 1859             Note:  This document was prepared using Dragon voice recognition software and may include unintentional dictation errors.    Pilar Jarvis, MD 12/03/22 Ebony Cargo

## 2022-12-03 NOTE — ED Triage Notes (Signed)
Pt presents via POV c/o generalized abd pain x3 days. Reports went to walk in clinic however was not seen. Denies N/V/D. Reports pain worse with breathing and moving. Points to epigastric region when asked where pain is.

## 2022-12-03 NOTE — Discharge Instructions (Addendum)
Take Pantoprazole daily.  Gastroenterology will call you for follow-up appointment.   Thank you for choosing [place] for your health care today!  Please see your primary doctor this week for a follow up appointment.   Sometimes, in the early stages of certain disease courses it is difficult to detect in the emergency department evaluation -- so, it is important that you continue to monitor your symptoms and call your doctor right away or return to the emergency department if you develop any new or worsening symptoms.  Please go to the following website to schedule new (and existing) patient appointments:   [email or web address]  If you do not have a primary doctor try calling the following clinics to establish care:  If you have insurance:  [facility] [phone] [address] [place] [place] [address]   [facility]  [phone] [address] [place] [place] [address]   If you do not have insurance:  [facility]  [phone] [address] [place] [address]   The following is another list of primary care offices in the area who are accepting new patients at this time.  Please reach out to one of them directly and let them know you would like to schedule an appointment to follow up on an Emergency Department visit, and/or to establish a new primary care provider (PCP).  There are likely other primary care clinics in the are who are accepting new patients, but this is an excellent place to start:  [facility] Lead physician: Dr Shirlee Latch [address] [place], [place] [address] [phone]  [facility] Lead Physician: Dr Alba Cory [address], [place], [place] [address] [phone]  [facility]  Lead Physician: Dr Olevia Perches [address] [place], [place] [address] [phone]  [facility] Lead Physician: Dr Sofie Hartigan [address] [place], [place], [place] [address] [phone]  [facility] Lead Physician: Dr Bari Edward [address] [place], [place] [address] [phone]   It was my pleasure to care for you today.   Daneil Dan Modesto Charon, MD

## 2022-12-03 NOTE — ED Provider Triage Note (Signed)
Emergency Medicine Provider Triage Evaluation Note  Phyllis Morton , a 46 y.o. female  was evaluated in triage.  Pt complains of epigastric pain radiating into back. Pain constant but worse with movement and deep inspiration. No cough, shob. No emesis, diarrhea, constipation, dysuria, hematuria.  Review of Systems  Positive: Epigastric pain radiating into back Negative: Fever, chills, emesis, diarrhea, urinary changes  Physical Exam  BP 123/80   Pulse 74   Temp 98.1 F (36.7 C) (Oral)   Resp 16   Ht 5\' 4"  (1.626 m)   Wt 56.7 kg   SpO2 98%   BMI 21.46 kg/m  Gen:   Awake, no distress   Resp:  Normal effort  MSK:   Moves extremities without difficulty  Other:    Medical Decision Making  Medically screening exam initiated at 4:46 PM.  Appropriate orders placed.  was informed that the remainder of the evaluation will be completed by another provider, this initial triage assessment does not replace that evaluation, and the importance of remaining in the ED until their evaluation is complete.  Patient arrives with epigastric pain radiating into the back.  No emesis, diarrhea, urinary changes, cough, shortness of breath.  Patient will have EKG, labs, chest x-ray, right upper quadrant abdominal ultrasound, urinalysis   Marge Duncans, PA-C 12/03/22 1646

## 2022-12-15 ENCOUNTER — Telehealth: Payer: BLUE CROSS/BLUE SHIELD | Admitting: Gastroenterology

## 2022-12-15 ENCOUNTER — Other Ambulatory Visit: Payer: Self-pay

## 2022-12-15 ENCOUNTER — Encounter: Payer: Self-pay | Admitting: Gastroenterology

## 2022-12-15 DIAGNOSIS — R1013 Epigastric pain: Secondary | ICD-10-CM | POA: Diagnosis not present

## 2022-12-15 DIAGNOSIS — D509 Iron deficiency anemia, unspecified: Secondary | ICD-10-CM | POA: Diagnosis not present

## 2022-12-15 DIAGNOSIS — Z8619 Personal history of other infectious and parasitic diseases: Secondary | ICD-10-CM

## 2022-12-15 MED ORDER — NA SULFATE-K SULFATE-MG SULF 17.5-3.13-1.6 GM/177ML PO SOLN: 354.0000 mL | Freq: Once | ORAL | 0 refills | Status: AC

## 2022-12-15 NOTE — Progress Notes (Signed)
Sherri Sear, MD [address]  [place]  [place], [place] [address]  Main: [phone]  Fax: [phone]    Gastroenterology Consultation Video Visit  Referring Provider:     Maximiano Coss, NP Primary Care Physician:  Maximiano Coss, NP Primary Gastroenterologist:  Dr. Cephas Darby Reason for Consultation: Epigastric pain        HPI:   Phyllis Morton is a 47 y.o. female referred by Maximiano Coss, NP  for consultation & management of epigastric pain  Virtual Visit Video Note  I connected with Phyllis Morton on 12/15/22 at  2:15 PM EST by video and verified that I am speaking with the correct person using two identifiers.   I discussed the limitations, risks, security and privacy concerns of performing an evaluation and management service by video and the availability of in person appointments. I also discussed with the patient that there may be a patient responsible charge related to this service. The patient expressed understanding and agreed to proceed.  Location of the Patient: Work  Location of the provider: Home office  Persons participating in the visit: Patient and provider only   History of Present Illness: Phyllis Morton is a 47 year old female with history of chronic iron deficiency anemia, history of H. pylori infection, treated with bismuth based quadruple therapy in 2021.  She reports 1 month history of epigastric burning pain and burning in her chest associated with early satiety, pain is worse postprandial.  She denies any weight loss.  She denies any melena or rectal bleeding or abdominal bloating.  She has severe iron deficiency anemia diagnosed in 09/2020, serum ferritin was 10, she takes oral iron daily, hemoglobin was first noted to be low 10.7 in 2021.  MCV 72.  She went to the ER on 12/03/2022 secondary to epigastric pain.  Labs revealed mild microcytic anemia, underwent right upper quadrant ultrasound which was unremarkable.  LFTs were normal.  Serum  lipase was normal.  Urine pregnancy test negative.  She denies any menorrhagia or blood in urine.  She was discharged on Protonix 40 mg daily which she takes in the morning and date provides some relief.  She denies any nausea or vomiting   She does not smoke or drink alcohol She denies any family history of GI malignancy  NSAIDs: None  Antiplts/Anticoagulants/Anti thrombotics: None  GI Procedures: None  Past Medical History:  Diagnosis Date   Helicobacter pylori gastritis    Miscarriage 06/29/2012   LMP 04/10/2012    Past Surgical History:  Procedure Laterality Date   CESAREAN SECTION       Current Outpatient Medications:    ipratropium (ATROVENT) 0.03 % nasal spray, Place 2 sprays into the nose 2 (two) times daily., Disp: 30 mL, Rfl: 5   pantoprazole (PROTONIX) 40 MG tablet, Take 1 tablet (40 mg total) by mouth daily., Disp: 90 tablet, Rfl: 0   sucralfate (CARAFATE) 1 g tablet, Take 1 tablet (1 g total) by mouth 4 (four) times daily -  with meals and at bedtime., Disp: 40 tablet, Rfl: 0  Current Facility-Administered Medications:    gi cocktail (Maalox,Lidocaine,Donnatal), 30 mL, Oral, Once, Le, Thao P, DO   Family History  Problem Relation Age of Onset   Hypertension Mother      Social History   Tobacco Use   Smoking status: Never   Smokeless tobacco: Never  Vaping Use   Vaping Use: Never used  Substance Use Topics   Alcohol use: No  Drug use: No    Allergies as of 12/15/2022   (No Known Allergies)     Imaging Studies: Reviewed  Assessment and Plan:   Phyllis Morton is a 47 y.o. Asian female with history of H. pylori gastritis status posttreatment, chronic iron deficiency anemia is seen in consultation for persistent iron deficiency anemia and 1 month history of epigastric pain  Recommend EGD with gastric and duodenal biopsies Recommend colonoscopy with possible TI evaluation If above workup is unremarkable, recommend video capsule endoscopy Continue  Protonix 40 mg daily for now Continue oral iron Recheck iron studies, B12 and folate levels   Follow Up Instructions:   I discussed the assessment and treatment plan with the patient. The patient was provided an opportunity to ask questions and all were answered. The patient agreed with the plan and demonstrated an understanding of the instructions.   The patient was advised to call back or seek an in-person evaluation if the symptoms worsen or if the condition fails to improve as anticipated.  I provided 25 minutes of face-to-face time during this encounter.   Follow up in 4 months   Cephas Darby, MD

## 2022-12-20 LAB — IRON,TIBC AND FERRITIN PANEL
Ferritin: 59 ng/mL (ref 15–150)
Iron Saturation: 18 % (ref 15–55)
Iron: 83 ug/dL (ref 27–159)
Total Iron Binding Capacity: 452 ug/dL — ABNORMAL HIGH (ref 250–450)
UIBC: 369 ug/dL (ref 131–425)

## 2022-12-20 LAB — B12 AND FOLATE PANEL
Folate: 15.1 ng/mL (ref >3.0)
Vitamin B-12: 764 pg/mL (ref 232–1245)

## 2022-12-21 ENCOUNTER — Telehealth: Payer: Self-pay

## 2022-12-21 NOTE — Telephone Encounter (Signed)
Patient states she will get PCP to send her to a provider that will be in network with her insurance. She states she will cancel the colonoscopy. Called Trish in endo and got procedure cancel

## 2022-12-21 NOTE — Telephone Encounter (Signed)
-----   Message from Shelby Mattocks, [place] sent at 12/20/2022  4:41 PM EST ----- Regarding: FW: OON insurance  ----- Message ----- From: [place]ie [place], CMA Sent: 12/20/2022   3:41 PM EST To: Shelby Mattocks, CMA Subject: OON insurance                                  Not sure if the patient had new [employer]BS insurance when she came in for an office appointment, but she now has [place] and is [place]. She has procedures scheduled. I went on the [employer] site and printed her cards. I will send this patient a message that you will contact her tomorrow to discuss cancelling procedures.

## 2023-01-09 ENCOUNTER — Ambulatory Visit: Admit: 2023-01-09 | Payer: BLUE CROSS/BLUE SHIELD | Admitting: Gastroenterology

## 2023-01-09 SURGERY — COLONOSCOPY WITH PROPOFOL
Anesthesia: General
